# Patient Record
Sex: Female | Born: 1935 | Race: White | Hispanic: No | Marital: Married | Smoking: Never smoker
Health system: Southern US, Community
[De-identification: ages and names within clinical notes are randomized; demographics above are authoritative.]

## PROBLEM LIST (undated history)

## (undated) DIAGNOSIS — K219 Gastro-esophageal reflux disease without esophagitis: Secondary | ICD-10-CM

## (undated) DIAGNOSIS — I10 Essential (primary) hypertension: Secondary | ICD-10-CM

## (undated) DIAGNOSIS — Z8719 Personal history of other diseases of the digestive system: Secondary | ICD-10-CM

## (undated) DIAGNOSIS — E042 Nontoxic multinodular goiter: Secondary | ICD-10-CM

## (undated) DIAGNOSIS — R609 Edema, unspecified: Secondary | ICD-10-CM

## (undated) DIAGNOSIS — E876 Hypokalemia: Secondary | ICD-10-CM

## (undated) DIAGNOSIS — M1990 Unspecified osteoarthritis, unspecified site: Secondary | ICD-10-CM

## (undated) DIAGNOSIS — G2581 Restless legs syndrome: Secondary | ICD-10-CM

## (undated) DIAGNOSIS — K5909 Other constipation: Secondary | ICD-10-CM

## (undated) HISTORY — PX: REPLACEMENT TOTAL KNEE: SUR1224

## (undated) HISTORY — PX: HERNIA REPAIR: SHX51

## (undated) HISTORY — PX: ABDOMINAL HYSTERECTOMY: SHX81

## (undated) HISTORY — PX: HIATAL HERNIA REPAIR: SHX195

---

## 2015-01-15 ENCOUNTER — Encounter (HOSPITAL_COMMUNITY): Payer: Self-pay | Admitting: Emergency Medicine

## 2015-01-15 ENCOUNTER — Emergency Department (HOSPITAL_COMMUNITY): Payer: Medicare Other

## 2015-01-15 ENCOUNTER — Inpatient Hospital Stay (HOSPITAL_COMMUNITY)
Admission: EM | Admit: 2015-01-15 | Discharge: 2015-01-17 | DRG: 683 | Disposition: A | Discharge status: Home / Self Care | Payer: Medicare Other | Attending: Internal Medicine | Admitting: Internal Medicine

## 2015-01-15 DIAGNOSIS — E860 Dehydration: Secondary | ICD-10-CM | POA: Diagnosis present

## 2015-01-15 DIAGNOSIS — Z7982 Long term (current) use of aspirin: Secondary | ICD-10-CM

## 2015-01-15 DIAGNOSIS — K529 Noninfective gastroenteritis and colitis, unspecified: Secondary | ICD-10-CM

## 2015-01-15 DIAGNOSIS — N179 Acute kidney failure, unspecified: Principal | ICD-10-CM | POA: Diagnosis present

## 2015-01-15 DIAGNOSIS — R55 Syncope and collapse: Secondary | ICD-10-CM | POA: Diagnosis present

## 2015-01-15 DIAGNOSIS — E041 Nontoxic single thyroid nodule: Secondary | ICD-10-CM

## 2015-01-15 DIAGNOSIS — G2581 Restless legs syndrome: Secondary | ICD-10-CM | POA: Diagnosis present

## 2015-01-15 DIAGNOSIS — M1990 Unspecified osteoarthritis, unspecified site: Secondary | ICD-10-CM | POA: Diagnosis present

## 2015-01-15 DIAGNOSIS — E042 Nontoxic multinodular goiter: Secondary | ICD-10-CM | POA: Diagnosis present

## 2015-01-15 DIAGNOSIS — I10 Essential (primary) hypertension: Secondary | ICD-10-CM | POA: Diagnosis present

## 2015-01-15 DIAGNOSIS — N390 Urinary tract infection, site not specified: Secondary | ICD-10-CM

## 2015-01-15 DIAGNOSIS — K5900 Constipation, unspecified: Secondary | ICD-10-CM | POA: Diagnosis present

## 2015-01-15 DIAGNOSIS — E538 Deficiency of other specified B group vitamins: Secondary | ICD-10-CM | POA: Diagnosis present

## 2015-01-15 DIAGNOSIS — N281 Cyst of kidney, acquired: Secondary | ICD-10-CM

## 2015-01-15 DIAGNOSIS — R197 Diarrhea, unspecified: Secondary | ICD-10-CM

## 2015-01-15 DIAGNOSIS — N289 Disorder of kidney and ureter, unspecified: Secondary | ICD-10-CM

## 2015-01-15 DIAGNOSIS — E876 Hypokalemia: Secondary | ICD-10-CM | POA: Diagnosis present

## 2015-01-15 DIAGNOSIS — K219 Gastro-esophageal reflux disease without esophagitis: Secondary | ICD-10-CM | POA: Diagnosis present

## 2015-01-15 DIAGNOSIS — Z7952 Long term (current) use of systemic steroids: Secondary | ICD-10-CM

## 2015-01-15 HISTORY — DX: Restless legs syndrome: G25.81

## 2015-01-15 HISTORY — DX: Gastro-esophageal reflux disease without esophagitis: K21.9

## 2015-01-15 HISTORY — DX: Hypokalemia: E87.6

## 2015-01-15 HISTORY — DX: Other constipation: K59.09

## 2015-01-15 HISTORY — DX: Edema, unspecified: R60.9

## 2015-01-15 HISTORY — DX: Unspecified osteoarthritis, unspecified site: M19.90

## 2015-01-15 HISTORY — DX: Essential (primary) hypertension: I10

## 2015-01-15 LAB — URINE MICROSCOPIC-ADD ON: RBC / HPF: NONE SEEN RBC/hpf (ref 0–5)

## 2015-01-15 LAB — CBC WITH DIFFERENTIAL/PLATELET
Basophils Absolute: 0 10*3/uL (ref 0.0–0.1)
Basophils Relative: 0 %
EOS PCT: 0 %
Eosinophils Absolute: 0 10*3/uL (ref 0.0–0.7)
HEMATOCRIT: 37.3 % (ref 36.0–46.0)
HEMOGLOBIN: 12.6 g/dL (ref 12.0–15.0)
LYMPHS ABS: 1.3 10*3/uL (ref 0.7–4.0)
LYMPHS PCT: 9 %
MCH: 31.5 pg (ref 26.0–34.0)
MCHC: 33.8 g/dL (ref 30.0–36.0)
MCV: 93.3 fL (ref 78.0–100.0)
Monocytes Absolute: 0.5 10*3/uL (ref 0.1–1.0)
Monocytes Relative: 3 %
NEUTROS ABS: 13 10*3/uL — AB (ref 1.7–7.7)
Neutrophils Relative %: 88 %
PLATELETS: 273 10*3/uL (ref 150–400)
RBC: 4 MIL/uL (ref 3.87–5.11)
RDW: 12.9 % (ref 11.5–15.5)
WBC: 14.8 10*3/uL — AB (ref 4.0–10.5)

## 2015-01-15 LAB — COMPREHENSIVE METABOLIC PANEL
ALK PHOS: 54 U/L (ref 38–126)
ALT: 17 U/L (ref 14–54)
AST: 26 U/L (ref 15–41)
Albumin: 4.1 g/dL (ref 3.5–5.0)
Anion gap: 9 (ref 5–15)
BILIRUBIN TOTAL: 1 mg/dL (ref 0.3–1.2)
BUN: 24 mg/dL — ABNORMAL HIGH (ref 6–20)
CALCIUM: 9.8 mg/dL (ref 8.9–10.3)
CO2: 29 mmol/L (ref 22–32)
Chloride: 96 mmol/L — ABNORMAL LOW (ref 101–111)
Creatinine, Ser: 1.91 mg/dL — ABNORMAL HIGH (ref 0.44–1.00)
GFR, EST AFRICAN AMERICAN: 26 mL/min — AB (ref >60)
GFR, EST NON AFRICAN AMERICAN: 23 mL/min — AB (ref >60)
GLUCOSE: 115 mg/dL — AB (ref 65–99)
Potassium: 3.9 mmol/L (ref 3.5–5.1)
SODIUM: 134 mmol/L — AB (ref 135–145)
Total Protein: 7 g/dL (ref 6.5–8.1)

## 2015-01-15 LAB — LACTIC ACID, PLASMA
Lactic Acid, Venous: 1.2 mmol/L (ref 0.5–2.0)
Lactic Acid, Venous: 2.1 mmol/L (ref 0.5–2.0)

## 2015-01-15 LAB — URINALYSIS, ROUTINE W REFLEX MICROSCOPIC
Bilirubin Urine: NEGATIVE
GLUCOSE, UA: NEGATIVE mg/dL
HGB URINE DIPSTICK: NEGATIVE
Nitrite: NEGATIVE
PH: 5.5 (ref 5.0–8.0)
Protein, ur: NEGATIVE mg/dL
SPECIFIC GRAVITY, URINE: 1.02 (ref 1.005–1.030)

## 2015-01-15 LAB — TROPONIN I: Troponin I: 0.03 ng/mL (ref <0.031)

## 2015-01-15 MED ORDER — ACETAMINOPHEN 325 MG PO TABS
650.0000 mg | ORAL_TABLET | Freq: Once | ORAL | Status: AC
  Administered 2015-01-15: 650 mg via ORAL
  Filled 2015-01-15: qty 2

## 2015-01-15 MED ORDER — SODIUM CHLORIDE 0.9 % IV BOLUS (SEPSIS)
500.0000 mL | Freq: Once | INTRAVENOUS | Status: AC
  Administered 2015-01-15: 500 mL via INTRAVENOUS

## 2015-01-15 MED ORDER — SODIUM CHLORIDE 0.9 % IV SOLN
INTRAVENOUS | Status: DC
  Administered 2015-01-15: 1000 mL via INTRAVENOUS

## 2015-01-15 MED ORDER — CIPROFLOXACIN IN D5W 400 MG/200ML IV SOLN
400.0000 mg | Freq: Once | INTRAVENOUS | Status: AC
  Administered 2015-01-15: 400 mg via INTRAVENOUS
  Filled 2015-01-15: qty 200

## 2015-01-15 MED ORDER — METRONIDAZOLE IN NACL 5-0.79 MG/ML-% IV SOLN
500.0000 mg | Freq: Once | INTRAVENOUS | Status: AC
  Administered 2015-01-15: 500 mg via INTRAVENOUS
  Filled 2015-01-15: qty 100

## 2015-01-15 NOTE — ED Notes (Signed)
Family requesting something for pt restless leg. EDP notified.

## 2015-01-15 NOTE — Progress Notes (Addendum)
ANTIBIOTIC CONSULT NOTE  Pharmacy Consult for Cipro Indication: colitis  No Known Allergies  Patient Measurements: Height: 5\' 7"  (170.2 cm) Weight: 200 lb (90.719 kg) IBW/kg (Calculated) : 61.6  Vital Signs: Temp: 97.4 F (36.3 C) (11/27 1649) Temp Source: Oral (11/27 1649) BP: 108/76 mmHg (11/27 2130) Pulse Rate: 103 (11/27 2130)  Labs:  Recent Labs  01/15/15 1744  WBC 14.8*  HGB 12.6  PLT 273  CREATININE 1.91*    Estimated Creatinine Clearance: 24.4 mL/min (by C-G formula based on Cr of 1.91).  No results for input(s): VANCOTROUGH, VANCOPEAK, VANCORANDOM, GENTTROUGH, GENTPEAK, GENTRANDOM, TOBRATROUGH, TOBRAPEAK, TOBRARND, AMIKACINPEAK, AMIKACINTROU, AMIKACIN in the last 72 hours.   Microbiology: No results found for this or any previous visit (from the past 720 hour(s)).  Medical History: Past Medical History  Diagnosis Date  . Arthritis   . Hypertension   . GERD (gastroesophageal reflux disease)   . Restless leg   . Hypokalemia   . Peripheral edema   . Chronic constipation    Anti-infectives    Start     Dose/Rate Route Frequency Ordered Stop   01/15/15 2200  ciprofloxacin (CIPRO) IVPB 400 mg     400 mg 200 mL/hr over 60 Minutes Intravenous  Once 01/15/15 2152     01/15/15 2115  metroNIDAZOLE (FLAGYL) IVPB 500 mg     500 mg 100 mL/hr over 60 Minutes Intravenous  Once 01/15/15 2114       Assessment: 81yo female.  Estimated Creatinine Clearance: 24.4 mL/min (by C-G formula based on Cr of 1.91). Cipro for colitis.   Plan:  Cont cipro 400 mg IV q24 hours F/u renal function, cultures and clinical course  Thanks for allowing pharmacy to be a part of this patient's care.  2115, PharmD Clinical Pharmacist 01/15/2015,9:58 PM

## 2015-01-15 NOTE — ED Provider Notes (Signed)
CSN: [id number]     Arrival date & time 01/15/15  1636 History   First MD Initiated Contact with Patient 01/15/15 1717     Chief Complaint  Patient presents with  . Loss of Consciousness      HPI  Pt was seen at 1720.  Per pt and her family, c/o sudden onset and resolution of one episode of syncope that occurred approximately 1500 PTA. Pt states she has hx of chronic constipation, took a laxative last night and today, with resultant diarrhea today. Pt was walking from room to room after using the commode (diarrhea) when she felt nauseated. Pt then walked into another room to "go outside to throw up" when family saw pt "pass out." Pt denies any other symptoms. Denies hx of same. Denies vomiting, no CP/palpitations, no SOB/cough, no abd pain, no fevers, no focal motor weakness, no tingling/numbness in extremities, no facial droop, no slurred speech, no black or blood in stools.    Past Medical History  Diagnosis Date  . Arthritis   . Hypertension   . GERD (gastroesophageal reflux disease)   . Restless leg   . Hypokalemia   . Peripheral edema   . Chronic constipation    Past Surgical History  Procedure Laterality Date  . Replacement total knee    . Hiatal hernia repair      Social History  Substance Use Topics  . Smoking status: Never Smoker   . Smokeless tobacco: None  . Alcohol Use: No    Review of Systems ROS: Statement: All systems negative except as marked or noted in the HPI; Constitutional: Negative for fever and chills. ; ; Eyes: Negative for eye pain, redness and discharge. ; ; ENMT: Negative for ear pain, hoarseness, nasal congestion, sinus pressure and sore throat. ; ; Cardiovascular: Negative for chest pain, palpitations, diaphoresis, dyspnea and peripheral edema. ; ; Respiratory: Negative for cough, wheezing and stridor. ; ; Gastrointestinal: +nausea, diarrhea. Negative for vomiting, abdominal pain, blood in stool, hematemesis, jaundice and rectal bleeding. . ; ;  Genitourinary: Negative for dysuria, flank pain and hematuria. ; ; Musculoskeletal: Negative for back pain and neck pain. Negative for swelling and trauma.; ; Skin: Negative for pruritus, rash, abrasions, blisters, bruising and skin lesion.; ; Neuro: Negative for headache, lightheadedness and neck stiffness. Negative for weakness, extremity weakness, paresthesias, involuntary movement, seizure and +syncope.      Allergies  Review of patient's allergies indicates no known allergies.  Home Medications   Prior to Admission medications   Not on File   BP 120/54 mmHg  Pulse 73  Temp(Src) 97.4 F (36.3 C) (Oral)  Resp 16  Ht 5\' 7"  (1.702 m)  Wt 200 lb (90.719 kg)  BMI 31.32 kg/m2  SpO2 98%   18:14 Orthostatic Vital Signs AC  Orthostatic Lying  - BP- Lying: 121/41 mmHg ; Pulse- Lying: 81  Orthostatic Sitting - BP- Sitting: 156/60 mmHg ; Pulse- Sitting: 88  Orthostatic Standing at 0 minutes - BP- Standing at 0 minutes: 141/61 mmHg ; Pulse- Standing at 0 minutes: 102      Physical Exam  1725: Physical examination:  Nursing notes reviewed; Vital signs and O2 SAT reviewed;  Constitutional: Well developed, Well nourished, In no acute distress; Head:  Normocephalic, atraumatic; Eyes: EOMI, PERRL, No scleral icterus; ENMT: Mouth and pharynx normal, Mucous membranes dry; Neck: Supple, Full range of motion, No lymphadenopathy; Cardiovascular: Regular rate and rhythm, No gallop; Respiratory: Breath sounds clear & equal bilaterally, No wheezes.  Speaking  full sentences with ease, Normal respiratory effort/excursion; Chest: Nontender, Movement normal; Abdomen: Soft, Nontender, Nondistended, Normal bowel sounds; Genitourinary: No CVA tenderness; Spine:  No midline CS, TS, LS tenderness.;; Extremities: Pulses normal, Pelvis stable. No tenderness, +2 pedal edema bilat. No calf asymmetry.; Neuro: AA&Ox3, Major CN grossly intact. Speech clear.  No facial droop.  No nystagmus. Grips equal. Strength 5/5 equal  bilat UE's and LE's.  DTR 2/4 equal bilat UE's and LE's.  No gross sensory deficits.  Normal cerebellar testing bilat UE's (finger-nose) and LE's (heel-shin)..; Skin: Color normal, Warm, Dry.   ED Course  Procedures (including critical care time)   Labs Review  Imaging Review I have personally reviewed and evaluated these images and lab results as part of my medical decision-making.   EKG Interpretation   Date/Time:  Sunday January 15 2015 17:39:34 EST Ventricular Rate:  71 PR Interval:  183 QRS Duration: 97 QT Interval:  424 QTC Calculation: 461 R Axis:   24 Text Interpretation:  Sinus rhythm Probable anteroseptal infarct, old  Borderline repolarization abnormality No old tracing to compare Confirmed  by [facility]  MD, Nicholos Johns [phone]) on 01/15/2015 5:58:25 PM      MDM  MDM Reviewed: previous chart, nursing note and vitals Reviewed previous: labs and ECG Interpretation: labs, ECG, x-ray and CT scan     Results for orders placed or performed during the hospital encounter of 01/15/15  Comprehensive metabolic panel  Result Value Ref Range   Sodium 134 (L) 135 - 145 mmol/L   Potassium 3.9 3.5 - 5.1 mmol/L   Chloride 96 (L) 101 - 111 mmol/L   CO2 29 22 - 32 mmol/L   Glucose, Bld 115 (H) 65 - 99 mg/dL   BUN 24 (H) 6 - 20 mg/dL   Creatinine, Ser 7.51 (H) 0.44 - 1.00 mg/dL   Calcium 9.8 8.9 - 70.0 mg/dL   Total Protein 7.0 6.5 - 8.1 g/dL   Albumin 4.1 3.5 - 5.0 g/dL   AST 26 15 - 41 U/L   ALT 17 14 - 54 U/L   Alkaline Phosphatase 54 38 - 126 U/L   Total Bilirubin 1.0 0.3 - 1.2 mg/dL   GFR calc non Af Amer 23 (L) >60 mL/min   GFR calc Af Amer 26 (L) >60 mL/min   Anion gap 9 5 - 15  Troponin I  Result Value Ref Range   Troponin I 0.03 <0.031 ng/mL  Lactic acid, plasma  Result Value Ref Range   Lactic Acid, Venous 1.2 0.5 - 2.0 mmol/L  CBC with Differential  Result Value Ref Range   WBC 14.8 (H) 4.0 - 10.5 K/uL   RBC 4.00 3.87 - 5.11 MIL/uL   Hemoglobin 12.6  12.0 - 15.0 g/dL   HCT 17.4 94.4 - 96.7 %   MCV 93.3 78.0 - 100.0 fL   MCH 31.5 26.0 - 34.0 pg   MCHC 33.8 30.0 - 36.0 g/dL   RDW 59.1 63.8 - 46.6 %   Platelets 273 150 - 400 K/uL   Neutrophils Relative % 88 %   Neutro Abs 13.0 (H) 1.7 - 7.7 K/uL   Lymphocytes Relative 9 %   Lymphs Abs 1.3 0.7 - 4.0 K/uL   Monocytes Relative 3 %   Monocytes Absolute 0.5 0.1 - 1.0 K/uL   Eosinophils Relative 0 %   Eosinophils Absolute 0.0 0.0 - 0.7 K/uL   Basophils Relative 0 %   Basophils Absolute 0.0 0.0 - 0.1 K/uL  Urinalysis, Routine w reflex  microscopic  Result Value Ref Range   Color, Urine AMBER (A) YELLOW   APPearance CLEAR CLEAR   Specific Gravity, Urine 1.020 1.005 - 1.030   pH 5.5 5.0 - 8.0   Glucose, UA NEGATIVE NEGATIVE mg/dL   Hgb urine dipstick NEGATIVE NEGATIVE   Bilirubin Urine NEGATIVE NEGATIVE   Ketones, ur TRACE (A) NEGATIVE mg/dL   Protein, ur NEGATIVE NEGATIVE mg/dL   Nitrite NEGATIVE NEGATIVE   Leukocytes, UA TRACE (A) NEGATIVE  Urine microscopic-add on  Result Value Ref Range   Squamous Epithelial / LPF 6-30 (A) NONE SEEN   WBC, UA 6-30 0 - 5 WBC/hpf   RBC / HPF NONE SEEN 0 - 5 RBC/hpf   Bacteria, UA MANY (A) NONE SEEN   Casts GRANULAR CAST (A) NEGATIVE   Urine-Other MUCOUS PRESENT    Ct Abdomen Pelvis Wo Contrast 01/15/2015  CLINICAL DATA:  Nausea and constipation today with syncope, diffuse cramping and intermittent abdominal pain EXAM: CT ABDOMEN AND PELVIS WITHOUT CONTRAST TECHNIQUE: Multidetector CT imaging of the abdomen and pelvis was performed following the standard protocol without IV contrast. COMPARISON:  01/15/2015 radiographs FINDINGS: Lower chest: Minimal pericardial thickening or fluid. Lung bases clear. Hepatobiliary: 3 low-attenuation lesions in the liver, 1 in the medial left lobe and 2 in the right lobe. These all measure about 2.5-3 cm with average attenuation value consistent with simple fluid. A few tiny hepatic calcifications are likely not of  acute consequence. Gallbladder is normal. Pancreas: There is fatty atrophy of the pancreas. Pancreas is otherwise negative. Spleen: Negative Adrenals/Urinary Tract: There is a 2.2 cm left adrenal gland with average attenuation value of 42. Right adrenal gland is normal. Indeterminate exophytic 1.3 cm lesion midpole right kidney. Indeterminate 1.4 cm exophytic lesion midpole left kidney. No stones or hydronephrosis. Bladder is negative. Stomach/Bowel: Stomach and small bowel are normal. Entire descending colon shows mild surrounding inflammatory change. There is mild diverticulosis of the sigmoid colon without evidence of diverticulitis. Vascular/Lymphatic: Atherosclerotic aortoiliac calcification Reproductive: Uterus not identified.  No pelvic masses Other: No free fluid or air in the abdomen or pelvis Musculoskeletal: no acute findings IMPRESSION: Findings consistent with nonspecific colitis diffusely involving the descending colon Incidental findings consisting of hepatic cysts. Indeterminate left adrenal gland. Possible adenoma. Consider nonemergent MRI to characterize further. Indeterminate bilateral renal lesions possibly cysts but not fully evaluated without contrast. Consider renal ultrasound. Electronically Signed   By: Esperanza Heir M.D.   On: 01/15/2015 21:09   Dg Chest 2 View 01/15/2015  CLINICAL DATA:  Nausea and constipation today with syncope EXAM: CHEST  2 VIEW COMPARISON:  None. FINDINGS: Heart size upper normal. Vascular pattern normal. Lungs clear. No pleural effusions. IMPRESSION: No active cardiopulmonary disease. Electronically Signed   By: Esperanza Heir M.D.   On: 01/15/2015 19:14   Ct Head Wo Contrast 01/15/2015  CLINICAL DATA:  Syncope today at home, fell EXAM: CT HEAD WITHOUT CONTRAST CT CERVICAL SPINE WITHOUT CONTRAST TECHNIQUE: Multidetector CT imaging of the head and cervical spine was performed following the standard protocol without intravenous contrast. Multiplanar CT image  reconstructions of the cervical spine were also generated. COMPARISON:  None. FINDINGS: CT HEAD FINDINGS Diffuse age-related atrophy. No hydrocephalus. No mass infarct hemorrhage or extra-axial fluid. No skull fracture. CT CERVICAL SPINE FINDINGS 2.5 cm heterogeneous left thyroid nodule with calcification. Other smaller nodules bilaterally. Lung apices clear. No acute soft tissue abnormalities. No evidence of cervical spine fracture. Normal alignment. Significant degenerative change at the atlantodental articulation. Moderate degenerative  disc disease throughout the entire cervical spine. No fracture. IMPRESSION: 1. No acute intracranial abnormality 2. Degenerative changes throughout the cervical spine with no fracture 3. Recommend nonemergent ultrasound of the thyroid for multiple nodules. Electronically Signed   By: Esperanza Heir M.D.   On: 01/15/2015 19:10   Ct Cervical Spine Wo Contrast 01/15/2015  CLINICAL DATA:  Syncope today at home, fell EXAM: CT HEAD WITHOUT CONTRAST CT CERVICAL SPINE WITHOUT CONTRAST TECHNIQUE: Multidetector CT imaging of the head and cervical spine was performed following the standard protocol without intravenous contrast. Multiplanar CT image reconstructions of the cervical spine were also generated. COMPARISON:  None. FINDINGS: CT HEAD FINDINGS Diffuse age-related atrophy. No hydrocephalus. No mass infarct hemorrhage or extra-axial fluid. No skull fracture. CT CERVICAL SPINE FINDINGS 2.5 cm heterogeneous left thyroid nodule with calcification. Other smaller nodules bilaterally. Lung apices clear. No acute soft tissue abnormalities. No evidence of cervical spine fracture. Normal alignment. Significant degenerative change at the atlantodental articulation. Moderate degenerative disc disease throughout the entire cervical spine. No fracture. IMPRESSION: 1. No acute intracranial abnormality 2. Degenerative changes throughout the cervical spine with no fracture 3. Recommend nonemergent  ultrasound of the thyroid for multiple nodules. Electronically Signed   By: Esperanza Heir M.D.   On: 01/15/2015 19:10   Dg Abd 2 Views 01/15/2015  CLINICAL DATA:  Nausea and constipation. Syncopal episode after exiting the restroom today. EXAM: ABDOMEN - 2 VIEW COMPARISON:  None. FINDINGS: The lung bases are clear.  The heart is enlarged. There is no evidence for obstruction or free air. There does appear to be some thickening about the distal sigmoid colon. Degenerative changes are noted in the lower lumbar spine and bilateral hips. IMPRESSION: 1. Question thickening about the distal colon. This may represent focal colitis. 2. No evidence for proximal obstruction or free air. 3. Degenerative changes in the lumbar spine and hips. Electronically Signed   By: Marin Roberts M.D.   On: 01/15/2015 19:16    2155:  IV cipro/flagyl started for colitis on CT scan as well as coverage for UTI. UC is pending. Pt is not orthostatic on VS. IVF continues for elevated BUN/Cr; no old to compare. Dx and testing d/w pt and family.  Questions answered.  Verb understanding, agreeable to admit.  T/C to [facility] Dr. Lovell Sheehan, case discussed, including:  HPI, pertinent PM/SHx, VS/PE, dx testing, ED course and treatment:  Agreeable to admit, requests to write temporary orders, obtain tele bed to team APAdmits.   Samuel Jester, DO 01/17/15 2000

## 2015-01-15 NOTE — ED Notes (Signed)
PT reports having nausea and constipation today and had a syncopal episode coming out of the bathroom today around 1500 per family.

## 2015-01-15 NOTE — H&P (Signed)
[facility] Admission History and Physical       Leah Crane GXQ:[id number] DOB: January 03, 1927 DOA: 01/15/2015  Referring physician: EDP PCP: Olen Cordial, MD  Specialists:   Chief Complaint: Passed Out  HPI: Leah Crane is a 81 y.o. female with history of HTN, and Arthritis who was brought to the ED after she suffered a syncopal episode at home.   Her daughter reports that she had been having constipation and took a lot of Senakot, and it made her have increased stools today and she went tot he bathroom, finished and came out and was walking about and became nauseous and passed out for a few seconds.  PEr her daughter she had been havign complaints of nausea and Cramping ABD pain.    EMS was called and she was brought to the ED.  A CT scan of the ABD was performed and revealed Colitis, and her labs revealed a positive UA, and she was paced on IV Cipro and Flagyl Rx, and referred for admission for a syncope workup.       Past Medical History  Diagnosis Date  . Arthritis   . Hypertension   . GERD (gastroesophageal reflux disease)   . Restless leg   . Hypokalemia   . Peripheral edema   . Chronic constipation      Past Surgical History  Procedure Laterality Date  . Replacement total knee    . Hiatal hernia repair        Prior to Admission medications   Medication Sig Start Date End Date Taking? Authorizing Provider  aspirin EC 81 MG tablet Take 81 mg by mouth every morning.   Yes Historical Provider, MD  calcium-vitamin D (OSCAL WITH D) 500-200 MG-UNIT tablet Take 1 tablet by mouth 2 (two) times daily.   Yes Historical Provider, MD  felodipine (PLENDIL) 10 MG 24 hr tablet Take 10 mg by mouth every morning.  01/05/15  Yes Historical Provider, MD  furosemide (LASIX) 40 MG tablet Take 80 mg by mouth daily as needed for fluid or edema.   Yes Historical Provider, MD  hydrochlorothiazide (HYDRODIURIL) 25 MG tablet Take 25 mg by mouth every morning.   Yes Historical  Provider, MD  HYDROcodone-acetaminophen (NORCO) 10-325 MG tablet Take 1 tablet by mouth 4 (four) times daily.   Yes Historical Provider, MD  hydroxychloroquine (PLAQUENIL) 200 MG tablet Take 200 mg by mouth 2 (two) times daily.   Yes Historical Provider, MD  metolazone (ZAROXOLYN) 2.5 MG tablet Take 2.5 mg by mouth every Monday, Wednesday, and Friday.   Yes Historical Provider, MD  omeprazole (PRILOSEC) 20 MG capsule Take 20 mg by mouth every morning.   Yes Historical Provider, MD  piroxicam (FELDENE) 10 MG capsule Take 10 mg by mouth every morning.   Yes Historical Provider, MD  potassium chloride SA (K-DUR,KLOR-CON) 20 MEQ tablet Take 20 mEq by mouth 2 (two) times daily.   Yes Historical Provider, MD  pramipexole (MIRAPEX) 0.125 MG tablet Take 0.125 mg by mouth at bedtime.   Yes Historical Provider, MD  predniSONE (DELTASONE) 5 MG tablet Take 5 mg by mouth every morning.   Yes Historical Provider, MD  quinapril (ACCUPRIL) 40 MG tablet Take 40 mg by mouth 2 (two) times daily.   Yes Historical Provider, MD  tiZANidine (ZANAFLEX) 4 MG tablet Take 4 mg by mouth 3 (three) times daily.   Yes Historical Provider, MD  vitamin B-12 (CYANOCOBALAMIN) 1000 MCG tablet Take 1,000 mcg by mouth every morning.   Yes  Historical Provider, MD     No Known Allergies  Social History:  reports that she has never smoked. She does not have any smokeless tobacco history on file. She reports that she does not drink alcohol or use illicit drugs.    History reviewed. No pertinent family history.     Physical Exam:  GEN:  Pleasant Elderly 81 y.o.  Caucasian female examined and in no acute distress; cooperative with exam Filed Vitals:   01/15/15 1930 01/15/15 2030 01/15/15 2130 01/15/15 2200  BP: 113/64 155/77 108/76 113/98  Pulse: 83 88 103 82  Temp:      TempSrc:      Resp: Height:      Weight:      SpO2: 93% 96% 99% 96%   Blood pressure 113/98, pulse 82, temperature 97.4 F (36.3 C),  temperature source Oral, resp. rate 24, height  (1.702 m), weight 90.719 kg (200 lb), SpO2 96 %. PSYCH: She is alert and oriented x4; does not appear anxious does not appear depressed; affect is normal HEENT: Normocephalic and Atraumatic, Mucous membranes pink; PERRLA; EOM intact; Fundi:  Benign;  No scleral icterus, Nares: Patent, Oropharynx: Clear,     Neck:  FROM, No Cervical Lymphadenopathy nor Thyromegaly or Carotid Bruit; No JVD; Breasts:: Not examined CHEST WALL: No tenderness CHEST: Normal respiration, clear to auscultation bilaterally HEART: Regular rate and rhythm; no murmurs rubs or gallops BACK: No kyphosis or scoliosis; No CVA tenderness ABDOMEN: Positive Bowel Sounds,  Soft Non-Tender, No Rebound or Guarding; No Masses, No Organomegaly, No Pannus; No Intertriginous candida. Rectal Exam: Not done EXTREMITIES: No Cyanosis, Clubbing, or Edema; No Ulcerations. Genitalia: not examined PULSES: 2+ and symmetric SKIN: Normal hydration no rash or ulceration CNS:  Alert and Oriented x 4, No Focal Deficits Vascular: pulses palpable throughout    Labs on Admission:  Basic Metabolic Panel:  Recent Labs Lab 01/15/15 1744  NA 134*  K 3.9  CL 96*  CO2 29  GLUCOSE 115*  BUN 24*  CREATININE 1.91*  CALCIUM 9.8   Liver Function Tests:  Recent Labs Lab 01/15/15 1744  AST 26  ALT 17  ALKPHOS 54  BILITOT 1.0  PROT 7.0  ALBUMIN 4.1   No results for input(s): LIPASE, AMYLASE in the last 168 hours. No results for input(s): AMMONIA in the last 168 hours. CBC:  Recent Labs Lab 01/15/15 1744  WBC 14.8*  NEUTROABS 13.0*  HGB 12.6  HCT 37.3  MCV 93.3  PLT 273   Cardiac Enzymes:  Recent Labs Lab 01/15/15 1744  TROPONINI 0.03    BNP (last 3 results) No results for input(s): BNP in the last 8760 hours.  ProBNP (last 3 results) No results for input(s): PROBNP in the last 8760 hours.  CBG: No results for input(s): GLUCAP in the last 168 hours.  Radiological  Exams on Admission: Ct Abdomen Pelvis Wo Contrast  01/15/2015  CLINICAL DATA:  Nausea and constipation today with syncope, diffuse cramping and intermittent abdominal pain EXAM: CT ABDOMEN AND PELVIS WITHOUT CONTRAST TECHNIQUE: Multidetector CT imaging of the abdomen and pelvis was performed following the standard protocol without IV contrast. COMPARISON:  01/15/2015 radiographs FINDINGS: Lower chest: Minimal pericardial thickening or fluid. Lung bases clear. Hepatobiliary: 3 low-attenuation lesions in the liver, 1 in the medial left lobe and 2 in the right lobe. These all measure about 2.5-3 cm with average attenuation value consistent with simple fluid. A few tiny hepatic calcifications are likely not  of acute consequence. Gallbladder is normal. Pancreas: There is fatty atrophy of the pancreas. Pancreas is otherwise negative. Spleen: Negative Adrenals/Urinary Tract: There is a 2.2 cm left adrenal gland with average attenuation value of 42. Right adrenal gland is normal. Indeterminate exophytic 1.3 cm lesion midpole right kidney. Indeterminate 1.4 cm exophytic lesion midpole left kidney. No stones or hydronephrosis. Bladder is negative. Stomach/Bowel: Stomach and small bowel are normal. Entire descending colon shows mild surrounding inflammatory change. There is mild diverticulosis of the sigmoid colon without evidence of diverticulitis. Vascular/Lymphatic: Atherosclerotic aortoiliac calcification Reproductive: Uterus not identified.  No pelvic masses Other: No free fluid or air in the abdomen or pelvis Musculoskeletal: no acute findings IMPRESSION: Findings consistent with nonspecific colitis diffusely involving the descending colon Incidental findings consisting of hepatic cysts. Indeterminate left adrenal gland. Possible adenoma. Consider nonemergent MRI to characterize further. Indeterminate bilateral renal lesions possibly cysts but not fully evaluated without contrast. Consider renal ultrasound.  Electronically Signed   By: Esperanza Heir M.D.   On: 01/15/2015 21:09   Dg Chest 2 View  01/15/2015  CLINICAL DATA:  Nausea and constipation today with syncope EXAM: CHEST  2 VIEW COMPARISON:  None. FINDINGS: Heart size upper normal. Vascular pattern normal. Lungs clear. No pleural effusions. IMPRESSION: No active cardiopulmonary disease. Electronically Signed   By: Esperanza Heir M.D.   On: 01/15/2015 19:14   Ct Head Wo Contrast  01/15/2015  CLINICAL DATA:  Syncope today at home, fell EXAM: CT HEAD WITHOUT CONTRAST CT CERVICAL SPINE WITHOUT CONTRAST TECHNIQUE: Multidetector CT imaging of the head and cervical spine was performed following the standard protocol without intravenous contrast. Multiplanar CT image reconstructions of the cervical spine were also generated. COMPARISON:  None. FINDINGS: CT HEAD FINDINGS Diffuse age-related atrophy. No hydrocephalus. No mass infarct hemorrhage or extra-axial fluid. No skull fracture. CT CERVICAL SPINE FINDINGS 2.5 cm heterogeneous left thyroid nodule with calcification. Other smaller nodules bilaterally. Lung apices clear. No acute soft tissue abnormalities. No evidence of cervical spine fracture. Normal alignment. Significant degenerative change at the atlantodental articulation. Moderate degenerative disc disease throughout the entire cervical spine. No fracture. IMPRESSION: 1. No acute intracranial abnormality 2. Degenerative changes throughout the cervical spine with no fracture 3. Recommend nonemergent ultrasound of the thyroid for multiple nodules. Electronically Signed   By: Esperanza Heir M.D.   On: 01/15/2015 19:10   Ct Cervical Spine Wo Contrast  01/15/2015  CLINICAL DATA:  Syncope today at home, fell EXAM: CT HEAD WITHOUT CONTRAST CT CERVICAL SPINE WITHOUT CONTRAST TECHNIQUE: Multidetector CT imaging of the head and cervical spine was performed following the standard protocol without intravenous contrast. Multiplanar CT image reconstructions of  the cervical spine were also generated. COMPARISON:  None. FINDINGS: CT HEAD FINDINGS Diffuse age-related atrophy. No hydrocephalus. No mass infarct hemorrhage or extra-axial fluid. No skull fracture. CT CERVICAL SPINE FINDINGS 2.5 cm heterogeneous left thyroid nodule with calcification. Other smaller nodules bilaterally. Lung apices clear. No acute soft tissue abnormalities. No evidence of cervical spine fracture. Normal alignment. Significant degenerative change at the atlantodental articulation. Moderate degenerative disc disease throughout the entire cervical spine. No fracture. IMPRESSION: 1. No acute intracranial abnormality 2. Degenerative changes throughout the cervical spine with no fracture 3. Recommend nonemergent ultrasound of the thyroid for multiple nodules. Electronically Signed   By: Esperanza Heir M.D.   On: 01/15/2015 19:10   Dg Abd 2 Views  01/15/2015  CLINICAL DATA:  Nausea and constipation. Syncopal episode after exiting the restroom today. EXAM: ABDOMEN -  2 VIEW COMPARISON:  None. FINDINGS: The lung bases are clear.  The heart is enlarged. There is no evidence for obstruction or free air. There does appear to be some thickening about the distal sigmoid colon. Degenerative changes are noted in the lower lumbar spine and bilateral hips. IMPRESSION: 1. Question thickening about the distal colon. This may represent focal colitis. 2. No evidence for proximal obstruction or free air. 3. Degenerative changes in the lumbar spine and hips. Electronically Signed   By: Marin Roberts M.D.   On: 01/15/2015 19:16     EKG: Independently reviewed. Normal Sinus Rhythm Rate =71 Old Anterior Infarct Changes present   Assessment/Plan:   81 y.o. female with  Principal Problem:   1.      Syncope   Syncope workup   Cardiac Monitoring  Active Problems:   2.     AKI (acute kidney injury) (HCC)   Gentle IVFs   Hold HCTZ, Metolazone , and Lasix PRN, and    Monitor BUN/Cr     3.      Colitis- on CT scan of the ADB   IV Cipro and Flagyl     4.     Urinary tract infectious disease   Covered By IV Cipro   Urine C+S sent    5.     Hypertension   continue Felodipine as BP tolerates   Monitor BPs       6.     DVT Prophylaxis   Lovenox    Code Status:     FULL CODE       Family Communication:   Daughter at Bedside   Disposition Plan:     Observation Status        Time spent:  16 Minutes      Ron Parker [facility] Pager [phone]   If 7AM -7PM Please Contact the Day Rounding Team MD for [facility]  If 7PM-7AM, Please Contact Night-Floor Coverage  [email or web address] Password [email or web address] 01/15/2015, 11:17 PM     ADDENDUM:   Patient was seen and examined on 01/15/2015

## 2015-01-15 NOTE — ED Notes (Signed)
EDP notified of lactic acid was drawn before canceling. Result value 2.1 given. Order replaced to allow value to cross into chart. 500 ml NS bolus ordered.

## 2015-01-16 DIAGNOSIS — Z7982 Long term (current) use of aspirin: Secondary | ICD-10-CM | POA: Diagnosis not present

## 2015-01-16 DIAGNOSIS — E860 Dehydration: Secondary | ICD-10-CM | POA: Diagnosis present

## 2015-01-16 DIAGNOSIS — I10 Essential (primary) hypertension: Secondary | ICD-10-CM | POA: Diagnosis present

## 2015-01-16 DIAGNOSIS — Z7952 Long term (current) use of systemic steroids: Secondary | ICD-10-CM | POA: Diagnosis not present

## 2015-01-16 DIAGNOSIS — M1990 Unspecified osteoarthritis, unspecified site: Secondary | ICD-10-CM | POA: Diagnosis present

## 2015-01-16 DIAGNOSIS — K529 Noninfective gastroenteritis and colitis, unspecified: Secondary | ICD-10-CM

## 2015-01-16 DIAGNOSIS — E876 Hypokalemia: Secondary | ICD-10-CM | POA: Diagnosis present

## 2015-01-16 DIAGNOSIS — N390 Urinary tract infection, site not specified: Secondary | ICD-10-CM

## 2015-01-16 DIAGNOSIS — K5900 Constipation, unspecified: Secondary | ICD-10-CM | POA: Diagnosis present

## 2015-01-16 DIAGNOSIS — G2581 Restless legs syndrome: Secondary | ICD-10-CM | POA: Diagnosis present

## 2015-01-16 DIAGNOSIS — N281 Cyst of kidney, acquired: Secondary | ICD-10-CM | POA: Diagnosis present

## 2015-01-16 DIAGNOSIS — K219 Gastro-esophageal reflux disease without esophagitis: Secondary | ICD-10-CM | POA: Diagnosis present

## 2015-01-16 DIAGNOSIS — E042 Nontoxic multinodular goiter: Secondary | ICD-10-CM | POA: Diagnosis present

## 2015-01-16 DIAGNOSIS — N179 Acute kidney failure, unspecified: Secondary | ICD-10-CM | POA: Diagnosis present

## 2015-01-16 DIAGNOSIS — E538 Deficiency of other specified B group vitamins: Secondary | ICD-10-CM | POA: Diagnosis present

## 2015-01-16 DIAGNOSIS — R55 Syncope and collapse: Secondary | ICD-10-CM | POA: Diagnosis present

## 2015-01-16 LAB — BASIC METABOLIC PANEL
ANION GAP: 7 (ref 5–15)
BUN: 20 mg/dL (ref 6–20)
CALCIUM: 8.7 mg/dL — AB (ref 8.9–10.3)
CHLORIDE: 100 mmol/L — AB (ref 101–111)
CO2: 26 mmol/L (ref 22–32)
CREATININE: 1.33 mg/dL — AB (ref 0.44–1.00)
GFR calc non Af Amer: 35 mL/min — ABNORMAL LOW (ref >60)
GFR, EST AFRICAN AMERICAN: 41 mL/min — AB (ref >60)
Glucose, Bld: 107 mg/dL — ABNORMAL HIGH (ref 65–99)
Potassium: 3 mmol/L — ABNORMAL LOW (ref 3.5–5.1)
SODIUM: 133 mmol/L — AB (ref 135–145)

## 2015-01-16 LAB — CBC
HCT: 33.2 % — ABNORMAL LOW (ref 36.0–46.0)
HEMOGLOBIN: 11.4 g/dL — AB (ref 12.0–15.0)
MCH: 32 pg (ref 26.0–34.0)
MCHC: 34.3 g/dL (ref 30.0–36.0)
MCV: 93.3 fL (ref 78.0–100.0)
PLATELETS: 235 10*3/uL (ref 150–400)
RBC: 3.56 MIL/uL — AB (ref 3.87–5.11)
RDW: 13 % (ref 11.5–15.5)
WBC: 6.3 10*3/uL (ref 4.0–10.5)

## 2015-01-16 LAB — LACTIC ACID, PLASMA
LACTIC ACID, VENOUS: 0.9 mmol/L (ref 0.5–2.0)
Lactic Acid, Venous: 0.7 mmol/L (ref 0.5–2.0)

## 2015-01-16 MED ORDER — ONDANSETRON HCL 4 MG/2ML IJ SOLN: 4.0000 mg | Freq: Four times a day (QID) | INTRAMUSCULAR | Status: DC | PRN

## 2015-01-16 MED ORDER — CIPROFLOXACIN IN D5W 400 MG/200ML IV SOLN
400.0000 mg | INTRAVENOUS | Status: DC
  Administered 2015-01-16: 400 mg via INTRAVENOUS
  Filled 2015-01-16 (×2): qty 200

## 2015-01-16 MED ORDER — ONDANSETRON HCL 4 MG PO TABS
4.0000 mg | ORAL_TABLET | Freq: Four times a day (QID) | ORAL | Status: DC | PRN
Start: 2015-01-16 — End: 2015-01-17

## 2015-01-16 MED ORDER — HYDROCODONE-ACETAMINOPHEN 10-325 MG PO TABS
1.0000 | ORAL_TABLET | Freq: Four times a day (QID) | ORAL | Status: DC
  Administered 2015-01-16 – 2015-01-17 (×4): 1 via ORAL
  Filled 2015-01-16 (×4): qty 1

## 2015-01-16 MED ORDER — HYDROXYCHLOROQUINE SULFATE 200 MG PO TABS
200.0000 mg | ORAL_TABLET | Freq: Two times a day (BID) | ORAL | Status: DC
  Administered 2015-01-16 – 2015-01-17 (×3): 200 mg via ORAL
  Filled 2015-01-16 (×5): qty 1

## 2015-01-16 MED ORDER — TIZANIDINE HCL 4 MG PO TABS
4.0000 mg | ORAL_TABLET | Freq: Three times a day (TID) | ORAL | Status: DC
  Administered 2015-01-16 – 2015-01-17 (×4): 4 mg via ORAL
  Filled 2015-01-16 (×4): qty 1

## 2015-01-16 MED ORDER — ACETAMINOPHEN 650 MG RE SUPP: 650.0000 mg | Freq: Four times a day (QID) | RECTAL | Status: DC | PRN

## 2015-01-16 MED ORDER — HYDROMORPHONE HCL 1 MG/ML IJ SOLN: 0.5000 mg | INTRAMUSCULAR | Status: DC | PRN

## 2015-01-16 MED ORDER — ASPIRIN EC 81 MG PO TBEC
81.0000 mg | DELAYED_RELEASE_TABLET | Freq: Every morning | ORAL | Status: DC
  Administered 2015-01-16 – 2015-01-17 (×2): 81 mg via ORAL
  Filled 2015-01-16 (×2): qty 1

## 2015-01-16 MED ORDER — METRONIDAZOLE IN NACL 5-0.79 MG/ML-% IV SOLN
500.0000 mg | Freq: Three times a day (TID) | INTRAVENOUS | Status: DC
  Administered 2015-01-16 (×3): 500 mg via INTRAVENOUS
  Filled 2015-01-16 (×4): qty 100

## 2015-01-16 MED ORDER — POTASSIUM CHLORIDE CRYS ER 20 MEQ PO TBCR
20.0000 meq | EXTENDED_RELEASE_TABLET | Freq: Two times a day (BID) | ORAL | Status: DC
  Administered 2015-01-16 – 2015-01-17 (×3): 20 meq via ORAL
  Filled 2015-01-16 (×4): qty 1

## 2015-01-16 MED ORDER — FELODIPINE ER 5 MG PO TB24
10.0000 mg | ORAL_TABLET | Freq: Every morning | ORAL | Status: DC
  Administered 2015-01-16 – 2015-01-17 (×2): 10 mg via ORAL
  Filled 2015-01-16 (×2): qty 2
  Filled 2015-01-16: qty 1

## 2015-01-16 MED ORDER — PANTOPRAZOLE SODIUM 40 MG PO TBEC
40.0000 mg | DELAYED_RELEASE_TABLET | Freq: Every day | ORAL | Status: DC
  Administered 2015-01-16 – 2015-01-17 (×2): 40 mg via ORAL
  Filled 2015-01-16 (×2): qty 1

## 2015-01-16 MED ORDER — PRAMIPEXOLE DIHYDROCHLORIDE 0.125 MG PO TABS
0.1250 mg | ORAL_TABLET | Freq: Every day | ORAL | Status: DC
  Administered 2015-01-16: 0.125 mg via ORAL
  Filled 2015-01-16 (×3): qty 1

## 2015-01-16 MED ORDER — SODIUM CHLORIDE 0.9 % IV SOLN
INTRAVENOUS | Status: DC
  Administered 2015-01-16: 75 mL/h via INTRAVENOUS
  Administered 2015-01-16: 23:00:00 via INTRAVENOUS
  Administered 2015-01-16: 75 mL/h via INTRAVENOUS

## 2015-01-16 MED ORDER — SODIUM CHLORIDE 0.9 % IJ SOLN
3.0000 mL | Freq: Two times a day (BID) | INTRAMUSCULAR | Status: DC
  Administered 2015-01-16 – 2015-01-17 (×2): 3 mL via INTRAVENOUS

## 2015-01-16 MED ORDER — HEPARIN SODIUM (PORCINE) 5000 UNIT/ML IJ SOLN
5000.0000 [IU] | Freq: Three times a day (TID) | INTRAMUSCULAR | Status: DC
  Administered 2015-01-16 – 2015-01-17 (×3): 5000 [IU] via SUBCUTANEOUS
  Filled 2015-01-16 (×2): qty 1

## 2015-01-16 MED ORDER — HYDRALAZINE HCL 20 MG/ML IJ SOLN: 10.0000 mg | Freq: Four times a day (QID) | INTRAMUSCULAR | Status: DC | PRN

## 2015-01-16 MED ORDER — ENOXAPARIN SODIUM 30 MG/0.3ML ~~LOC~~ SOLN
30.0000 mg | SUBCUTANEOUS | Status: DC
  Administered 2015-01-16: 30 mg via SUBCUTANEOUS
  Filled 2015-01-16: qty 0.3

## 2015-01-16 MED ORDER — CIPROFLOXACIN IN D5W 400 MG/200ML IV SOLN: 400.0000 mg | Freq: Two times a day (BID) | INTRAVENOUS | Status: DC

## 2015-01-16 MED ORDER — OXYCODONE HCL 5 MG PO TABS
5.0000 mg | ORAL_TABLET | ORAL | Status: DC | PRN
  Administered 2015-01-16: 5 mg via ORAL
  Filled 2015-01-16: qty 1

## 2015-01-16 MED ORDER — PREDNISONE 10 MG PO TABS
5.0000 mg | ORAL_TABLET | Freq: Every morning | ORAL | Status: DC
  Administered 2015-01-16 – 2015-01-17 (×2): 5 mg via ORAL
  Filled 2015-01-16 (×2): qty 1

## 2015-01-16 MED ORDER — SODIUM CHLORIDE 0.9 % IV SOLN
INTRAVENOUS | Status: AC
  Administered 2015-01-16: 75 mL/h via INTRAVENOUS

## 2015-01-16 MED ORDER — VITAMIN B-12 1000 MCG PO TABS
1000.0000 ug | ORAL_TABLET | Freq: Every morning | ORAL | Status: DC
  Administered 2015-01-16 – 2015-01-17 (×2): 1000 ug via ORAL
  Filled 2015-01-16 (×2): qty 1

## 2015-01-16 MED ORDER — ACETAMINOPHEN 325 MG PO TABS
650.0000 mg | ORAL_TABLET | Freq: Four times a day (QID) | ORAL | Status: DC | PRN
  Administered 2015-01-16: 650 mg via ORAL
  Filled 2015-01-16 (×2): qty 2

## 2015-01-16 MED ORDER — ALUM & MAG HYDROXIDE-SIMETH 200-200-20 MG/5ML PO SUSP
30.0000 mL | Freq: Four times a day (QID) | ORAL | Status: DC | PRN
Start: 2015-01-16 — End: 2015-01-17

## 2015-01-16 NOTE — Care Management Note (Signed)
Case Management Note  Patient Details  Name: Aylanie Cubillos MRN: [id number] Date of Birth: March 04, 1926  Subjective/Objective:                  Pt admitted from home with syncope episode. Pt lives with her husband and will return home at discharge. Pt has a cane and walker but is essentially independent with ADL's. Pt has 4 children who are very active in the care of the pt.  Action/Plan: Awaiting PT eval. Will continue to follow for discharge planning needs.  Expected Discharge Date:                  Expected Discharge Plan:  Home/Self Care  In-House Referral:  NA  Discharge planning Services  CM Consult  Post Acute Care Choice:  NA Choice offered to:  NA  DME Arranged:    DME Agency:     HH Arranged:    HH Agency:     Status of Service:  Completed, signed off  Medicare Important Message Given:    Date Medicare IM Given:    Medicare IM give by:    Date Additional Medicare IM Given:    Additional Medicare Important Message give by:     If discussed at Long Length of Stay Meetings, dates discussed:    Additional Comments:  Cheryl Flash, RN 01/16/2015, 10:29 AM

## 2015-01-16 NOTE — Care Management Obs Status (Signed)
MEDICARE OBSERVATION STATUS NOTIFICATION   Patient Details  Name: Leah Crane MRN: [id number] Date of Birth: December 13, 1926   Medicare Observation Status Notification Given:  Yes    Cheryl Flash, RN 01/16/2015, 10:28 AM

## 2015-01-16 NOTE — Evaluation (Signed)
Physical Therapy Evaluation Patient Details Name: Leah Crane MRN: [id number] DOB: [birth year] Today's Date: 01/16/2015   History of Present Illness  HPI: Leah Crane is a 81 y.o. female with history of HTN, and Arthritis who was brought to the ED after she suffered a syncopal episode at home. Her daughter reports that she had been having constipation and took a lot of Senakot, and it made her have increased stools today and she went tot he bathroom, finished and came out and was walking about and became nauseous and passed out for a few seconds. PEr her daughter she had been havign complaints of nausea and Cramping ABD pain. EMS was called and she was brought to the ED. A CT scan of the ABD was performed and revealed Colitis, and her labs revealed a positive UA, and she was paced on IV Cipro and Flagyl Rx, and referred for admission for a syncope workup.  Clinical Impression  Pt was seen for evaluation.  She was alert and oriented, very cooperative with no c/o.  Daughter present who reports no problems at home.  Pt is found to be at functional baseline.  Her standing balance is somewhat diminished and I have recommended that she use her walker whenever outside of her home.    Follow Up Recommendations No PT follow up    Equipment Recommendations  None recommended by PT    Recommendations for Other Services   none    Precautions / Restrictions Precautions Precautions: None;Fall Restrictions Weight Bearing Restrictions: No      Mobility  Bed Mobility Overal bed mobility: Independent                Transfers Overall transfer level: Independent                  Ambulation/Gait Ambulation/Gait assistance: Modified independent (Device/Increase time) Ambulation Distance (Feet): 160 Feet Assistive device: None Gait Pattern/deviations: Decreased stride length;Decreased dorsiflexion - right;Decreased dorsiflexion - left;Narrow base of support   Gait velocity  interpretation: <1.8 ft/sec, indicative of risk for recurrent falls General Gait Details: no LOB during gait  Stairs            Wheelchair Mobility    Modified Rankin (Stroke Patients Only)       Balance Overall balance assessment: Needs assistance Sitting-balance support: No upper extremity supported;Feet supported Sitting balance-Leahy Scale: Normal     Standing balance support: No upper extremity supported;During functional activity Standing balance-Leahy Scale: Fair                               Pertinent Vitals/Pain Pain Assessment: No/denies pain    Home Living Family/patient expects to be discharged to:: Private residence Living Arrangements: Children;Spouse/significant other Available Help at Discharge: Family Type of Home: House Home Access: Level entry     Home Layout: One level;Laundry or work area in [employer] Equipment: Environmental consultant - 2 wheels;Cane - single point      Prior Function Level of Independence: Independent         Comments: frequently "furniture walks" at home     Hand Dominance        Extremity/Trunk Assessment   Upper Extremity Assessment: Overall WFL for tasks assessed           Lower Extremity Assessment: Overall WFL for tasks assessed      Cervical / Trunk Assessment: Kyphotic  Communication   Communication: HOH  Cognition Arousal/Alertness: Awake/alert Behavior  During Therapy: WFL for tasks assessed/performed Overall Cognitive Status: Within Functional Limits for tasks assessed                      General Comments      Exercises        Assessment/Plan    PT Assessment Patent does not need any further PT services  PT Diagnosis     PT Problem List    PT Treatment Interventions     PT Goals (Current goals can be found in the Care Plan section) Acute Rehab PT Goals PT Goal Formulation: All assessment and education complete, DC therapy    Frequency     Barriers to discharge         Co-evaluation               End of Session Equipment Utilized During Treatment: Gait belt Activity Tolerance: Patient tolerated treatment well Patient left: in bed;with call bell/phone within reach;with family/visitor present      Functional Assessment Tool Used: clinical judgement Functional Limitation: Mobility: Walking and moving around Mobility: Walking and Moving Around Current Status [phone]): 0 percent impaired, limited or restricted Mobility: Walking and Moving Around Goal Status [phone]): 0 percent impaired, limited or restricted Mobility: Walking and Moving Around Discharge Status [phone]): 0 percent impaired, limited or restricted    Time: 4174-0814 PT Time Calculation (min) (ACUTE ONLY): 35 min   Charges:   PT Evaluation $Initial PT Evaluation Tier I: 1 Procedure     PT G Codes:   PT G-Codes **NOT FOR INPATIENT CLASS** Functional Assessment Tool Used: clinical judgement Functional Limitation: Mobility: Walking and moving around Mobility: Walking and Moving Around Current Status ([id number]): 0 percent impaired, limited or restricted Mobility: Walking and Moving Around Goal Status (U3149): 0 percent impaired, limited or restricted Mobility: Walking and Moving Around Discharge Status (F0263): 0 percent impaired, limited or restricted    Konrad Penta  PT 01/16/2015, 11:54 AM [phone]

## 2015-01-16 NOTE — Progress Notes (Addendum)
[facility] PROGRESS NOTE  Leah Crane QQP:[id number] DOB: [birth year] DOA: 01/15/2015 PCP: Olen Cordial, MD  Brief narrative 81 year old female with history of hypertension and arthritis brought to the ED after staining a syncopal episode at home. Patient was constipated for past several days and took some senna code followed which she had several episodes of loose bowel movements. When she came out of the bathroom and walked towards the kitchen she became nauseous and passed out for a few seconds. Daughter was not present at that time but other family members did witness the fall. Patient was also complaining of nausea and cramping abdominal pain on the day of admission. Patient brought to the hospital by EMS. A CT scan of the abdomen done in the ED showed descending colitis. UA was positive for UTI. He was also found to have acute kidney injury and elevated white count. Patient placed on IV Cipro and Flagyl and admitted to hospitalist service.   Assessment/Plan: Syncope Possibly vasovagal versus associated with dehydration. Stable on telemetry. Orthostasis negative. Head CT and cervical spine unremarkable.  Acute descending colitis As seen on CT scan. No further abdominal pain or other GI symptoms. Continue empiric Cipro and Flagyl. Had mild elevated lactate on admission. Will follow-up.   UTI Asymptomatic. Follow cultures. Being covered with ciprofloxacin.  Acute kidney injury Secondary to dehydration. Also on  HCTZ, metolazone and NSAID ( piroxicam) at home. Held all.  Improving with IV fluids  Uncontrolled hypertension Resume felodipine. Holding HCTZ and metolazone given AKA and hypokalemia.. Added hydralazine  Hypokalemia Replenished with KCl.  Constipation Daughter reports this to be ongoing problem and not improved with MiraLAX. I have not added any bowel regimen since patient having loose bowel movement on admission. Would benefit from adding senna and Colace upon  discharge in addition to miralax.  Multiple thyroid nodules Seen incidentally on cervical spine CT. Check TSH and thyroid ultrasound.  ? Left adenoidal adenoma Incidentally seen on abdominal CT. Recommend non-emergent MRI. This can be done as outpatient.  Bilateral renal cysts (indeterminate) Also seen incidentally on CT abdomen without contrast. Follow-up with renal ultrasound  Chronic arthritis Continue plaquenil  and low-dose prednisone.  B12 deficiency  continue supplement  DVT prophylaxis: Subcutaneous heparin   Diet:regular  Code Status: Full code Family Communication: Daughter Debbie at bedside Disposition Plan: Home once improved. PT evaluation.   Consultants:  None  Procedures:  CT head and cervical spine    CT abdomen  Renal ultrasound  Thyroid ultrasound    Antibiotics:  Cipro and Flagyl since 11/27  HPI/Subjective: Patient seen and examined. Denies further abdominal pain or diarrhea.  Objective: Filed Vitals:   01/16/15 0800 01/16/15 1236  BP: 157/51 130/114  Pulse: 79 56  Temp:    Resp:      Intake/Output Summary (Last 24 hours) at 01/16/15 1546 Last data filed at 01/16/15 1526  Gross per 24 hour  Intake 1322.5 ml  Output      4 ml  Net 1318.5 ml   Filed Weights   01/15/15 1649 01/16/15 0014  Weight: 90.719 kg (200 lb) 84.6 kg (186 lb 8.2 oz)    Exam:   General:  Elderly female not in distress, hard of hearing  HEENT: No pallor, moist oral mucosa, supple neck  Chest: Clear to auscultation bilaterally  CVS: Normal S1 and S2, no murmurs rub or gallop  GI: Soft, nondistended, nontender, bowel sounds present  Musculoskeletal warm, no edema neck  CNS: Alert and oriented  Data Reviewed: Basic Metabolic Panel:  Recent Labs Lab 01/15/15 1744 01/16/15 0602  NA 134* 133*  K 3.9 3.0*  CL 96* 100*  CO2 29 26  GLUCOSE 115* 107*  BUN 24* 20  CREATININE 1.91* 1.33*  CALCIUM 9.8 8.7*   Liver Function  Tests:  Recent Labs Lab 01/15/15 1744  AST 26  ALT 17  ALKPHOS 54  BILITOT 1.0  PROT 7.0  ALBUMIN 4.1   No results for input(s): LIPASE, AMYLASE in the last 168 hours. No results for input(s): AMMONIA in the last 168 hours. CBC:  Recent Labs Lab 01/15/15 1744 01/16/15 0602  WBC 14.8* 6.3  NEUTROABS 13.0*  --   HGB 12.6 11.4*  HCT 37.3 33.2*  MCV 93.3 93.3  PLT 273 235   Cardiac Enzymes:  Recent Labs Lab 01/15/15 1744  TROPONINI 0.03   BNP (last 3 results) No results for input(s): BNP in the last 8760 hours.  ProBNP (last 3 results) No results for input(s): PROBNP in the last 8760 hours.  CBG: No results for input(s): GLUCAP in the last 168 hours.  No results found for this or any previous visit (from the past 240 hour(s)).   Studies: Ct Abdomen Pelvis Wo Contrast  01/15/2015  CLINICAL DATA:  Nausea and constipation today with syncope, diffuse cramping and intermittent abdominal pain EXAM: CT ABDOMEN AND PELVIS WITHOUT CONTRAST TECHNIQUE: Multidetector CT imaging of the abdomen and pelvis was performed following the standard protocol without IV contrast. COMPARISON:  01/15/2015 radiographs FINDINGS: Lower chest: Minimal pericardial thickening or fluid. Lung bases clear. Hepatobiliary: 3 low-attenuation lesions in the liver, 1 in the medial left lobe and 2 in the right lobe. These all measure about 2.5-3 cm with average attenuation value consistent with simple fluid. A few tiny hepatic calcifications are likely not of acute consequence. Gallbladder is normal. Pancreas: There is fatty atrophy of the pancreas. Pancreas is otherwise negative. Spleen: Negative Adrenals/Urinary Tract: There is a 2.2 cm left adrenal gland with average attenuation value of 42. Right adrenal gland is normal. Indeterminate exophytic 1.3 cm lesion midpole right kidney. Indeterminate 1.4 cm exophytic lesion midpole left kidney. No stones or hydronephrosis. Bladder is negative. Stomach/Bowel:  Stomach and small bowel are normal. Entire descending colon shows mild surrounding inflammatory change. There is mild diverticulosis of the sigmoid colon without evidence of diverticulitis. Vascular/Lymphatic: Atherosclerotic aortoiliac calcification Reproductive: Uterus not identified.  No pelvic masses Other: No free fluid or air in the abdomen or pelvis Musculoskeletal: no acute findings IMPRESSION: Findings consistent with nonspecific colitis diffusely involving the descending colon Incidental findings consisting of hepatic cysts. Indeterminate left adrenal gland. Possible adenoma. Consider nonemergent MRI to characterize further. Indeterminate bilateral renal lesions possibly cysts but not fully evaluated without contrast. Consider renal ultrasound. Electronically Signed   By: Esperanza Heir M.D.   On: 01/15/2015 21:09   Dg Chest 2 View  01/15/2015  CLINICAL DATA:  Nausea and constipation today with syncope EXAM: CHEST  2 VIEW COMPARISON:  None. FINDINGS: Heart size upper normal. Vascular pattern normal. Lungs clear. No pleural effusions. IMPRESSION: No active cardiopulmonary disease. Electronically Signed   By: Esperanza Heir M.D.   On: 01/15/2015 19:14   Ct Head Wo Contrast  01/15/2015  CLINICAL DATA:  Syncope today at home, fell EXAM: CT HEAD WITHOUT CONTRAST CT CERVICAL SPINE WITHOUT CONTRAST TECHNIQUE: Multidetector CT imaging of the head and cervical spine was performed following the standard protocol without intravenous contrast. Multiplanar CT image reconstructions of the cervical spine  were also generated. COMPARISON:  None. FINDINGS: CT HEAD FINDINGS Diffuse age-related atrophy. No hydrocephalus. No mass infarct hemorrhage or extra-axial fluid. No skull fracture. CT CERVICAL SPINE FINDINGS 2.5 cm heterogeneous left thyroid nodule with calcification. Other smaller nodules bilaterally. Lung apices clear. No acute soft tissue abnormalities. No evidence of cervical spine fracture. Normal  alignment. Significant degenerative change at the atlantodental articulation. Moderate degenerative disc disease throughout the entire cervical spine. No fracture. IMPRESSION: 1. No acute intracranial abnormality 2. Degenerative changes throughout the cervical spine with no fracture 3. Recommend nonemergent ultrasound of the thyroid for multiple nodules. Electronically Signed   By: Esperanza Heir M.D.   On: 01/15/2015 19:10   Ct Cervical Spine Wo Contrast  01/15/2015  CLINICAL DATA:  Syncope today at home, fell EXAM: CT HEAD WITHOUT CONTRAST CT CERVICAL SPINE WITHOUT CONTRAST TECHNIQUE: Multidetector CT imaging of the head and cervical spine was performed following the standard protocol without intravenous contrast. Multiplanar CT image reconstructions of the cervical spine were also generated. COMPARISON:  None. FINDINGS: CT HEAD FINDINGS Diffuse age-related atrophy. No hydrocephalus. No mass infarct hemorrhage or extra-axial fluid. No skull fracture. CT CERVICAL SPINE FINDINGS 2.5 cm heterogeneous left thyroid nodule with calcification. Other smaller nodules bilaterally. Lung apices clear. No acute soft tissue abnormalities. No evidence of cervical spine fracture. Normal alignment. Significant degenerative change at the atlantodental articulation. Moderate degenerative disc disease throughout the entire cervical spine. No fracture. IMPRESSION: 1. No acute intracranial abnormality 2. Degenerative changes throughout the cervical spine with no fracture 3. Recommend nonemergent ultrasound of the thyroid for multiple nodules. Electronically Signed   By: Esperanza Heir M.D.   On: 01/15/2015 19:10   Dg Abd 2 Views  01/15/2015  CLINICAL DATA:  Nausea and constipation. Syncopal episode after exiting the restroom today. EXAM: ABDOMEN - 2 VIEW COMPARISON:  None. FINDINGS: The lung bases are clear.  The heart is enlarged. There is no evidence for obstruction or free air. There does appear to be some thickening about  the distal sigmoid colon. Degenerative changes are noted in the lower lumbar spine and bilateral hips. IMPRESSION: 1. Question thickening about the distal colon. This may represent focal colitis. 2. No evidence for proximal obstruction or free air. 3. Degenerative changes in the lumbar spine and hips. Electronically Signed   By: Marin Roberts M.D.   On: 01/15/2015 19:16    Scheduled Meds: . aspirin EC  81 mg Oral q morning - 10a  . ciprofloxacin  400 mg Intravenous Q24H  . enoxaparin (LOVENOX) injection  30 mg Subcutaneous Q24H  . felodipine  10 mg Oral q morning - 10a  . HYDROcodone-acetaminophen  1 tablet Oral QID  . hydroxychloroquine  200 mg Oral BID  . metronidazole  500 mg Intravenous Q8H  . pantoprazole  40 mg Oral Daily  . potassium chloride SA  20 mEq Oral BID  . pramipexole  0.125 mg Oral QHS  . predniSONE  5 mg Oral q morning - 10a  . sodium chloride  3 mL Intravenous Q12H  . tiZANidine  4 mg Oral TID  . vitamin B-12  1,000 mcg Oral q morning - 10a   Continuous Infusions: . sodium chloride 1,000 mL (01/15/15 1923)  . sodium chloride 75 mL/hr (01/16/15 6759)     Time spent: 25 minutes    Ameyah Bangura  [facility] Pager [phone]. If 7PM-7AM, please contact night-coverage at [email or web address], password [facility] 01/16/2015, 3:46 PM  LOS: 0 days

## 2015-01-17 ENCOUNTER — Inpatient Hospital Stay (HOSPITAL_COMMUNITY): Payer: Medicare Other

## 2015-01-17 DIAGNOSIS — K529 Noninfective gastroenteritis and colitis, unspecified: Secondary | ICD-10-CM

## 2015-01-17 DIAGNOSIS — N179 Acute kidney failure, unspecified: Principal | ICD-10-CM

## 2015-01-17 DIAGNOSIS — N390 Urinary tract infection, site not specified: Secondary | ICD-10-CM

## 2015-01-17 DIAGNOSIS — R55 Syncope and collapse: Secondary | ICD-10-CM

## 2015-01-17 LAB — CBC
HCT: 29.8 % — ABNORMAL LOW (ref 36.0–46.0)
Hemoglobin: 10.1 g/dL — ABNORMAL LOW (ref 12.0–15.0)
MCH: 32 pg (ref 26.0–34.0)
MCHC: 33.9 g/dL (ref 30.0–36.0)
MCV: 94.3 fL (ref 78.0–100.0)
PLATELETS: 185 10*3/uL (ref 150–400)
RBC: 3.16 MIL/uL — AB (ref 3.87–5.11)
RDW: 13 % (ref 11.5–15.5)
WBC: 4.2 10*3/uL (ref 4.0–10.5)

## 2015-01-17 LAB — BASIC METABOLIC PANEL
Anion gap: 5 (ref 5–15)
BUN: 16 mg/dL (ref 6–20)
CHLORIDE: 102 mmol/L (ref 101–111)
CO2: 28 mmol/L (ref 22–32)
CREATININE: 1.06 mg/dL — AB (ref 0.44–1.00)
Calcium: 8.8 mg/dL — ABNORMAL LOW (ref 8.9–10.3)
GFR calc non Af Amer: 46 mL/min — ABNORMAL LOW (ref >60)
GFR, EST AFRICAN AMERICAN: 54 mL/min — AB (ref >60)
Glucose, Bld: 88 mg/dL (ref 65–99)
Potassium: 3.6 mmol/L (ref 3.5–5.1)
Sodium: 135 mmol/L (ref 135–145)

## 2015-01-17 LAB — T4, FREE: Free T4: 0.95 ng/dL (ref 0.61–1.12)

## 2015-01-17 LAB — TSH: TSH: 0.382 u[IU]/mL (ref 0.350–4.500)

## 2015-01-17 MED ORDER — DOCUSATE SODIUM 100 MG PO CAPS: 100.0000 mg | ORAL_CAPSULE | Freq: Two times a day (BID) | ORAL | Status: DC

## 2015-01-17 MED ORDER — METRONIDAZOLE 500 MG PO TABS: 500.0000 mg | ORAL_TABLET | Freq: Three times a day (TID) | ORAL | Status: AC

## 2015-01-17 MED ORDER — CIPROFLOXACIN HCL 250 MG PO TABS
500.0000 mg | ORAL_TABLET | Freq: Two times a day (BID) | ORAL | Status: DC
Start: 2015-01-17 — End: 2015-01-17
  Administered 2015-01-17: 500 mg via ORAL
  Filled 2015-01-17: qty 2

## 2015-01-17 MED ORDER — CIPROFLOXACIN HCL 500 MG PO TABS: 500.0000 mg | ORAL_TABLET | Freq: Two times a day (BID) | ORAL | Status: AC

## 2015-01-17 MED ORDER — METRONIDAZOLE 500 MG PO TABS
500.0000 mg | ORAL_TABLET | Freq: Three times a day (TID) | ORAL | Status: DC
  Administered 2015-01-17: 500 mg via ORAL
  Filled 2015-01-17: qty 1

## 2015-01-17 NOTE — Care Management Note (Signed)
Case Management Note  Patient Details  Name: Leah Crane MRN: [id number] Date of Birth: February 07, 1927  Subjective/Objective:                    Action/Plan:   Expected Discharge Date:                  Expected Discharge Plan:  Home/Self Care  In-House Referral:  NA  Discharge planning Services  CM Consult  Post Acute Care Choice:  NA Choice offered to:  NA  DME Arranged:    DME Agency:     HH Arranged:    HH Agency:     Status of Service:  Completed, signed off  Medicare Important Message Given:  N/A - LOS <3 / Initial given by admissions Date Medicare IM Given:    Medicare IM give by:    Date Additional Medicare IM Given:    Additional Medicare Important Message give by:     If discussed at Long Length of Stay Meetings, dates discussed:    Additional Comments: Pt discharged home today. No CM needs noted. Arlyss Queen [place], RN 01/17/2015, 11:36 AM

## 2015-01-17 NOTE — Care Management Note (Signed)
Case Management Note  Patient Details  Name: Madaleine Simmon MRN: [id number] Date of Birth: [birth year]  Subjective/Objective:                    Action/Plan:   Expected Discharge Date:                  Expected Discharge Plan:  Home/Self Care  In-House Referral:  NA  Discharge planning Services  CM Consult  Post Acute Care Choice:  NA Choice offered to:  NA  DME Arranged:    DME Agency:     HH Arranged:    HH Agency:     Status of Service:  Completed, signed off  Medicare Important Message Given:    Date Medicare IM Given:    Medicare IM give by:    Date Additional Medicare IM Given:    Additional Medicare Important Message give by:     If discussed at Long Length of Stay Meetings, dates discussed:    Additional Comments: Pt discharged home today. No CM needs noted. Arlyss Queen [place], RN 01/17/2015, 11:30 AM

## 2015-01-17 NOTE — Progress Notes (Signed)
Patient ambulated the hallway without any device. Tolerated well. No pain stated and VSS stable. Lesly Dukes, RN

## 2015-01-17 NOTE — Discharge Summary (Signed)
Discharge Summary  Leah Crane ZOX:[id number] DOB: Mar 16, 1926  PCP: Olen Cordial, MD  Admit date: 01/15/2015 Discharge date: 01/17/2015  Time spent: 25 minutes   Recommendations for Outpatient Follow-up:  New medication: Cipro 500 mg by mouth twice a day 3 days New medication: Flagyl 500 mg every 8 hours 3 days New medication: Colace 100 mg by mouth twice a day Patient will keep scheduled PCP appointment on the third week of December. At that time decision will be made on patient's goiter as well as follow-up on thyroid function studies  Discharge Diagnoses:  Active Hospital Problems   Diagnosis Date Noted  . Syncope 01/15/2015  . AKI (acute kidney injury) (HCC) 01/16/2015  . Colitis 01/16/2015  . Urinary tract infectious disease 01/16/2015  . Hypertension 01/16/2015    Resolved Hospital Problems   Diagnosis Date Noted Date Resolved  No resolved problems to display.    Discharge Condition: improved, being discharged home  Diet recommendation: Low sodium   Filed Weights   01/15/15 1649 01/16/15 0014  Weight: 90.719 kg (200 lb) 84.6 kg (186 lb 8.2 oz)    History of present illness:   81 year old female with past oral history of hypertension and issues with significant constipation been ongoing for many months now admitted on 11/27 after syncopal event. Patient had been having difficulty moving her bowels for several days after taking a large amount of medications, was finally able to have several large bowel movements at home. After being on the commode and then standing up, she started from very lightheaded and then passed out. She was brought into the emergency room were she's found to have a UTI as well as colitis.   Hospital Course:  Principal Problem:   Syncope: Looked to be vasovagal versus dehydration secondary UTI. Workup negative. Head CT unremarkable. Patient back to baseline. Active Problems:   AKI (acute kidney injury) (HCC) : secondary to UTI. Improved  with hydration and back to near normal by day of discharge.    Colitis: Patient started on Cipro and Flagyl and this will be continued upon discharge   Urinary tract infection: Already on Cipro. Cultures pending    Hypertension: Antihypertensives held on admission. On hydration, blood pressure in the 160s. Okay to resume diuretic antihypertensives on discharge.  Thyroid cysts/goiter: Thyroid cysts noted on head CT. Ultrasound notes presence of large goiter with area large enough for biopsy.after discussion with patient's daughter and patient, they opt for checking thyroid function levels and then could continue to monitor and recheck thyroid ultrasound in 6 months. TSH ordered which is normal. Free T4 and free T3 ordered and are pending     Renal cysts: Noted on abdominal CT: Renal chest on checked and found to be unrevealing  Procedures:  None   Consultations:  None    Discharge Exam: BP 161/69 mmHg  Pulse 70  Temp(Src) 97.7 F (36.5 C) (Oral)  Resp 20  Ht  (1.702 m)  Wt 84.6 kg (186 lb 8.2 oz)  BMI 29.20 kg/m2  SpO2 97%  General: alert and oriented 3, no acute distress  Cardiovascular: regular rate and rhythm, S1 and S2   Respiratory: clear to auscultation bilaterally    Discharge Instructions You were cared for by a hospitalist during your hospital stay. If you have any questions about your discharge medications or the care you received while you were in the hospital after you are discharged, you can call the unit and asked to speak with the hospitalist on call if  the hospitalist that took care of you is not available. Once you are discharged, your primary care physician will handle any further medical issues. Please note that NO REFILLS for any discharge medications will be authorized once you are discharged, as it is imperative that you return to your primary care physician (or establish a relationship with a primary care physician if you do not have one) for your  aftercare needs so that they can reassess your need for medications and monitor your lab values.  Discharge Instructions    Diet - low sodium heart healthy    Complete by:  As directed      Increase activity slowly    Complete by:  As directed             Medication List    TAKE these medications        aspirin EC 81 MG tablet  Take 81 mg by mouth every morning.     calcium-vitamin D 500-200 MG-UNIT tablet  Commonly known as:  OSCAL WITH D  Take 1 tablet by mouth 2 (two) times daily.     ciprofloxacin 500 MG tablet  Commonly known as:  CIPRO  Take 1 tablet (500 mg total) by mouth 2 (two) times daily.     docusate sodium 100 MG capsule  Commonly known as:  COLACE  Take 1 capsule (100 mg total) by mouth 2 (two) times daily.     felodipine 10 MG 24 hr tablet  Commonly known as:  PLENDIL  Take 10 mg by mouth every morning.     furosemide 40 MG tablet  Commonly known as:  LASIX  Take 80 mg by mouth daily as needed for fluid or edema.     hydrochlorothiazide 25 MG tablet  Commonly known as:  HYDRODIURIL  Take 25 mg by mouth every morning.     HYDROcodone-acetaminophen 10-325 MG tablet  Commonly known as:  NORCO  Take 1 tablet by mouth 4 (four) times daily.     hydroxychloroquine 200 MG tablet  Commonly known as:  PLAQUENIL  Take 200 mg by mouth 2 (two) times daily.     metolazone 2.5 MG tablet  Commonly known as:  ZAROXOLYN  Take 2.5 mg by mouth every Monday, Wednesday, and Friday.     metroNIDAZOLE 500 MG tablet  Commonly known as:  FLAGYL  Take 1 tablet (500 mg total) by mouth every 8 (eight) hours.     omeprazole 20 MG capsule  Commonly known as:  PRILOSEC  Take 20 mg by mouth every morning.     piroxicam 10 MG capsule  Commonly known as:  FELDENE  Take 10 mg by mouth every morning.     potassium chloride SA 20 MEQ tablet  Commonly known as:  K-DUR,KLOR-CON  Take 20 mEq by mouth 2 (two) times daily.     pramipexole 0.125 MG tablet  Commonly known  as:  MIRAPEX  Take 0.125 mg by mouth at bedtime.     predniSONE 5 MG tablet  Commonly known as:  DELTASONE  Take 5 mg by mouth every morning.     quinapril 40 MG tablet  Commonly known as:  ACCUPRIL  Take 40 mg by mouth 2 (two) times daily.     tiZANidine 4 MG tablet  Commonly known as:  ZANAFLEX  Take 4 mg by mouth 3 (three) times daily.     vitamin B-12 1000 MCG tablet  Commonly known as:  CYANOCOBALAMIN  Take 1,000 mcg by  mouth every morning.       No Known Allergies     Follow-up Information    Follow up with Olen Cordial, MD On 01/24/2015.   Specialty:  Family Medicine   Why:  at 8:30 am   Contact information:   [address] [place] [address] [phone]        The results of significant diagnostics from this hospitalization (including imaging, microbiology, ancillary and laboratory) are listed below for reference.    Significant Diagnostic Studies: Ct Abdomen Pelvis Wo Contrast  01/15/2015  CLINICAL DATA:  Nausea and constipation today with syncope, diffuse cramping and intermittent abdominal pain EXAM: CT ABDOMEN AND PELVIS WITHOUT CONTRAST TECHNIQUE: Multidetector CT imaging of the abdomen and pelvis was performed following the standard protocol without IV contrast. COMPARISON:  01/15/2015 radiographs FINDINGS: Lower chest: Minimal pericardial thickening or fluid. Lung bases clear. Hepatobiliary: 3 low-attenuation lesions in the liver, 1 in the medial left lobe and 2 in the right lobe. These all measure about 2.5-3 cm with average attenuation value consistent with simple fluid. A few tiny hepatic calcifications are likely not of acute consequence. Gallbladder is normal. Pancreas: There is fatty atrophy of the pancreas. Pancreas is otherwise negative. Spleen: Negative Adrenals/Urinary Tract: There is a 2.2 cm left adrenal gland with average attenuation value of 42. Right adrenal gland is normal. Indeterminate exophytic 1.3 cm lesion midpole right kidney.  Indeterminate 1.4 cm exophytic lesion midpole left kidney. No stones or hydronephrosis. Bladder is negative. Stomach/Bowel: Stomach and small bowel are normal. Entire descending colon shows mild surrounding inflammatory change. There is mild diverticulosis of the sigmoid colon without evidence of diverticulitis. Vascular/Lymphatic: Atherosclerotic aortoiliac calcification Reproductive: Uterus not identified.  No pelvic masses Other: No free fluid or air in the abdomen or pelvis Musculoskeletal: no acute findings IMPRESSION: Findings consistent with nonspecific colitis diffusely involving the descending colon Incidental findings consisting of hepatic cysts. Indeterminate left adrenal gland. Possible adenoma. Consider nonemergent MRI to characterize further. Indeterminate bilateral renal lesions possibly cysts but not fully evaluated without contrast. Consider renal ultrasound. Electronically Signed   By: Esperanza Heir M.D.   On: 01/15/2015 21:09   Dg Chest 2 View  01/15/2015  CLINICAL DATA:  Nausea and constipation today with syncope EXAM: CHEST  2 VIEW COMPARISON:  None. FINDINGS: Heart size upper normal. Vascular pattern normal. Lungs clear. No pleural effusions. IMPRESSION: No active cardiopulmonary disease. Electronically Signed   By: Esperanza Heir M.D.   On: 01/15/2015 19:14   Ct Head Wo Contrast  01/15/2015  CLINICAL DATA:  Syncope today at home, fell EXAM: CT HEAD WITHOUT CONTRAST CT CERVICAL SPINE WITHOUT CONTRAST TECHNIQUE: Multidetector CT imaging of the head and cervical spine was performed following the standard protocol without intravenous contrast. Multiplanar CT image reconstructions of the cervical spine were also generated. COMPARISON:  None. FINDINGS: CT HEAD FINDINGS Diffuse age-related atrophy. No hydrocephalus. No mass infarct hemorrhage or extra-axial fluid. No skull fracture. CT CERVICAL SPINE FINDINGS 2.5 cm heterogeneous left thyroid nodule with calcification. Other smaller  nodules bilaterally. Lung apices clear. No acute soft tissue abnormalities. No evidence of cervical spine fracture. Normal alignment. Significant degenerative change at the atlantodental articulation. Moderate degenerative disc disease throughout the entire cervical spine. No fracture. IMPRESSION: 1. No acute intracranial abnormality 2. Degenerative changes throughout the cervical spine with no fracture 3. Recommend nonemergent ultrasound of the thyroid for multiple nodules. Electronically Signed   By: Esperanza Heir M.D.   On: 01/15/2015 19:10   Ct Cervical  Spine Wo Contrast  01/15/2015  CLINICAL DATA:  Syncope today at home, fell EXAM: CT HEAD WITHOUT CONTRAST CT CERVICAL SPINE WITHOUT CONTRAST TECHNIQUE: Multidetector CT imaging of the head and cervical spine was performed following the standard protocol without intravenous contrast. Multiplanar CT image reconstructions of the cervical spine were also generated. COMPARISON:  None. FINDINGS: CT HEAD FINDINGS Diffuse age-related atrophy. No hydrocephalus. No mass infarct hemorrhage or extra-axial fluid. No skull fracture. CT CERVICAL SPINE FINDINGS 2.5 cm heterogeneous left thyroid nodule with calcification. Other smaller nodules bilaterally. Lung apices clear. No acute soft tissue abnormalities. No evidence of cervical spine fracture. Normal alignment. Significant degenerative change at the atlantodental articulation. Moderate degenerative disc disease throughout the entire cervical spine. No fracture. IMPRESSION: 1. No acute intracranial abnormality 2. Degenerative changes throughout the cervical spine with no fracture 3. Recommend nonemergent ultrasound of the thyroid for multiple nodules. Electronically Signed   By: Esperanza Heir M.D.   On: 01/15/2015 19:10   US Soft Tissue Head/neck  01/17/2015  CLINICAL DATA:  Multinodular thyroid gland detected by a recent CT of the cervical spine. EXAM: THYROID ULTRASOUND TECHNIQUE: Ultrasound examination of the  thyroid gland and adjacent soft tissues was performed. COMPARISON:  CT of the cervical spine on 01/15/2015 FINDINGS: Right thyroid lobe Measurements: 4.9 x 1.8 x 1.5 cm. The right lobe is heterogeneous in echotexture. Solid nodule in the inferior right lobe measures approximately 1.4 x 1.0 x 1.5 cm. This nodule is noncalcified. Adjacent smaller partially solid and partially cystic nodule measures approximately 0.8 x 0.6 x 0.6 cm. Another adjacent partially solid and partially cystic nodule measures 0.6 cm. Left thyroid lobe Measurements: 5.3 x 2.3 x 1.9 cm. Left lobe echotexture is also heterogeneous. Partially solid and partially cystic nodule in the inferior left lobe measures approximately 2.6 x 1.7 x 1.6 cm. This nodule is noncalcified. Isthmus Thickness: 1 cm.  No nodules visualized. Lymphadenopathy None visualized. IMPRESSION: Thyroid goiter with mild thyroid enlargement, left greater than right, and heterogeneous parenchymal echotexture. Several nodules are identified with a dominant solid and cystic nodule in the inferior left lobe measuring 2.6 cm in greatest diameter. This does meet size criteria for biopsy. However, given the presence of other nodules including a 1.5 cm nodule in the inferior right lobe, it would also be reasonable to follow thyroid nodules by ultrasound (6-12 months) prior to making a determination about biopsy given the patient's age. Electronically Signed   By: Irish Lack M.D.   On: 01/17/2015 09:38   US Renal  01/17/2015  CLINICAL DATA:  81 year old patient with acute renal failure. Renal cysts seen on CT. EXAM: RENAL / URINARY TRACT ULTRASOUND COMPLETE COMPARISON:  CT abdomen pelvis 01/15/2015 FINDINGS: Right Kidney: Length: 9.7 cm. The echogenicity of the cortex is diffusely increased and there are areas of cortical thinning at the poles. Negative for hydronephrosis. There is an exophytic 1.6 x 1.3 x 1.5 cm midpole simple cyst. Left Kidney: Length: 11.8 cm in length.  Echogenicity of the renal cortex is diffusely increased. There are focal areas of thinning of the cortex particular at the poles. Negative for hydronephrosis. There is an exophytic superior pole simple cyst measuring 1.5 x 1.3 x 1.4 cm. Bladder: Appears normal for degree of bladder distention. IMPRESSION: 1. Negative for hydronephrosis. 2. Increased echogenicity and thinning of the renal cortices bilaterally suggests chronic medical renal disease. 3. Each kidney contains a single simple cyst, the largest measuring 1.6 cm on the right. Electronically Signed   By: Darl Pikes  Turner M.D.   On: 01/17/2015 09:37   Dg Abd 2 Views  01/15/2015  CLINICAL DATA:  Nausea and constipation. Syncopal episode after exiting the restroom today. EXAM: ABDOMEN - 2 VIEW COMPARISON:  None. FINDINGS: The lung bases are clear.  The heart is enlarged. There is no evidence for obstruction or free air. There does appear to be some thickening about the distal sigmoid colon. Degenerative changes are noted in the lower lumbar spine and bilateral hips. IMPRESSION: 1. Question thickening about the distal colon. This may represent focal colitis. 2. No evidence for proximal obstruction or free air. 3. Degenerative changes in the lumbar spine and hips. Electronically Signed   By: Marin Roberts M.D.   On: 01/15/2015 19:16    Microbiology: No results found for this or any previous visit (from the past 240 hour(s)).   Labs: Basic Metabolic Panel:  Recent Labs Lab 01/15/15 1744 01/16/15 0602 01/17/15 0652  NA 134* 133* 135  K 3.9 3.0* 3.6  CL 96* 100* 102  CO2 29 26 28   GLUCOSE 115* 107* 88  BUN 24* 20 16  CREATININE 1.91* 1.33* 1.06*  CALCIUM 9.8 8.7* 8.8*   Liver Function Tests:  Recent Labs Lab 01/15/15 1744  AST 26  ALT 17  ALKPHOS 54  BILITOT 1.0  PROT 7.0  ALBUMIN 4.1   No results for input(s): LIPASE, AMYLASE in the last 168 hours. No results for input(s): AMMONIA in the last 168 hours. CBC:  Recent  Labs Lab 01/15/15 1744 01/16/15 0602 01/17/15 0652  WBC 14.8* 6.3 4.2  NEUTROABS 13.0*  --   --   HGB 12.6 11.4* 10.1*  HCT 37.3 33.2* 29.8*  MCV 93.3 93.3 94.3  PLT 273 235 185   Cardiac Enzymes:  Recent Labs Lab 01/15/15 1744  TROPONINI 0.03   BNP: BNP (last 3 results) No results for input(s): BNP in the last 8760 hours.  ProBNP (last 3 results) No results for input(s): PROBNP in the last 8760 hours.  CBG: No results for input(s): GLUCAP in the last 168 hours.     Signed:  01/17/15  [facility] 01/17/2015, 4:07 PM

## 2015-01-17 NOTE — Care Management Important Message (Signed)
Important Message  Patient Details  Name: Leah Crane MRN: [id number] Date of Birth: 28-May-1926   Medicare Important Message Given:  N/A - LOS <3 / Initial given by admissions    Cheryl Flash, RN 01/17/2015, 11:35 AM

## 2015-01-17 NOTE — Progress Notes (Signed)
Discharged PT per MD order and protocol. Reviewed discharge teaching and handouts given. Pt verbalized understanding and left with all belongings. VSS. IV catheter D/C.  Patient wheeled down by staff member. Rosezella Kronick J Everett, RN  

## 2015-01-17 NOTE — Progress Notes (Signed)
PT Cancellation Note  Patient Details Name: Leah Crane MRN: [id number] DOB: Oct 21, 1926   Cancelled Treatment:    Reason Eval/Treat Not Completed: Other (comment).  We received a consult to do a PT evaluation.  She was actually seen yesterday, 01-16-15 and found to be at functional baseline.  No further PT is needed.  If there are new concerns that need to be addressed, please let me know.   Myrlene Broker L  PT 01/17/2015, 10:00 AM [phone]

## 2015-01-18 LAB — T3, FREE: T3, Free: 2.1 pg/mL (ref 2.0–4.4)

## 2015-09-19 DEATH — deceased

## 2015-12-30 ENCOUNTER — Encounter (HOSPITAL_COMMUNITY): Payer: Self-pay | Admitting: Emergency Medicine

## 2015-12-30 ENCOUNTER — Emergency Department (HOSPITAL_COMMUNITY): Payer: Medicare Other

## 2015-12-30 ENCOUNTER — Observation Stay (HOSPITAL_COMMUNITY)
Admission: EM | Admit: 2015-12-30 | Discharge: 2015-12-31 | Disposition: A | Discharge status: Home / Self Care | Payer: Medicare Other | Attending: Family Medicine | Admitting: Family Medicine

## 2015-12-30 DIAGNOSIS — Z79899 Other long term (current) drug therapy: Secondary | ICD-10-CM | POA: Diagnosis not present

## 2015-12-30 DIAGNOSIS — K219 Gastro-esophageal reflux disease without esophagitis: Secondary | ICD-10-CM | POA: Diagnosis present

## 2015-12-30 DIAGNOSIS — R079 Chest pain, unspecified: Principal | ICD-10-CM | POA: Diagnosis present

## 2015-12-30 DIAGNOSIS — R072 Precordial pain: Secondary | ICD-10-CM | POA: Diagnosis present

## 2015-12-30 DIAGNOSIS — I10 Essential (primary) hypertension: Secondary | ICD-10-CM | POA: Diagnosis not present

## 2015-12-30 DIAGNOSIS — Z7982 Long term (current) use of aspirin: Secondary | ICD-10-CM | POA: Diagnosis not present

## 2015-12-30 DIAGNOSIS — R109 Unspecified abdominal pain: Secondary | ICD-10-CM | POA: Diagnosis not present

## 2015-12-30 DIAGNOSIS — Z8719 Personal history of other diseases of the digestive system: Secondary | ICD-10-CM

## 2015-12-30 HISTORY — DX: Nontoxic multinodular goiter: E04.2

## 2015-12-30 HISTORY — DX: Personal history of other diseases of the digestive system: Z87.19

## 2015-12-30 LAB — CBC WITH DIFFERENTIAL/PLATELET
BASOS ABS: 0 10*3/uL (ref 0.0–0.1)
BASOS PCT: 1 %
EOS ABS: 0.2 10*3/uL (ref 0.0–0.7)
EOS PCT: 3 %
HCT: 33.7 % — ABNORMAL LOW (ref 36.0–46.0)
Hemoglobin: 11.1 g/dL — ABNORMAL LOW (ref 12.0–15.0)
Lymphocytes Relative: 19 %
Lymphs Abs: 1.1 10*3/uL (ref 0.7–4.0)
MCH: 31.2 pg (ref 26.0–34.0)
MCHC: 32.9 g/dL (ref 30.0–36.0)
MCV: 94.7 fL (ref 78.0–100.0)
MONO ABS: 0.5 10*3/uL (ref 0.1–1.0)
MONOS PCT: 8 %
NEUTROS ABS: 4.1 10*3/uL (ref 1.7–7.7)
Neutrophils Relative %: 69 %
PLATELETS: 257 10*3/uL (ref 150–400)
RBC: 3.56 MIL/uL — ABNORMAL LOW (ref 3.87–5.11)
RDW: 13.1 % (ref 11.5–15.5)
WBC: 5.9 10*3/uL (ref 4.0–10.5)

## 2015-12-30 LAB — COMPREHENSIVE METABOLIC PANEL
ALBUMIN: 3.7 g/dL (ref 3.5–5.0)
ALT: 13 U/L — ABNORMAL LOW (ref 14–54)
ANION GAP: 7 (ref 5–15)
AST: 21 U/L (ref 15–41)
Alkaline Phosphatase: 65 U/L (ref 38–126)
BILIRUBIN TOTAL: 0.5 mg/dL (ref 0.3–1.2)
BUN: 14 mg/dL (ref 6–20)
CHLORIDE: 101 mmol/L (ref 101–111)
CO2: 28 mmol/L (ref 22–32)
Calcium: 9.4 mg/dL (ref 8.9–10.3)
Creatinine, Ser: 1.01 mg/dL — ABNORMAL HIGH (ref 0.44–1.00)
GFR calc Af Amer: 56 mL/min — ABNORMAL LOW (ref >60)
GFR calc non Af Amer: 49 mL/min — ABNORMAL LOW (ref >60)
GLUCOSE: 89 mg/dL (ref 65–99)
POTASSIUM: 3.7 mmol/L (ref 3.5–5.1)
SODIUM: 136 mmol/L (ref 135–145)
TOTAL PROTEIN: 6.6 g/dL (ref 6.5–8.1)

## 2015-12-30 LAB — URINALYSIS, ROUTINE W REFLEX MICROSCOPIC
Bilirubin Urine: NEGATIVE
GLUCOSE, UA: NEGATIVE mg/dL
Hgb urine dipstick: NEGATIVE
KETONES UR: NEGATIVE mg/dL
LEUKOCYTES UA: NEGATIVE
NITRITE: NEGATIVE
PH: 6 (ref 5.0–8.0)
PROTEIN: NEGATIVE mg/dL
Specific Gravity, Urine: 1.01 (ref 1.005–1.030)

## 2015-12-30 LAB — TROPONIN I: Troponin I: 0.03 ng/mL (ref <0.03)

## 2015-12-30 MED ORDER — PANTOPRAZOLE SODIUM 40 MG PO TBEC
40.0000 mg | DELAYED_RELEASE_TABLET | Freq: Two times a day (BID) | ORAL | Status: DC
  Administered 2015-12-30 – 2015-12-31 (×2): 40 mg via ORAL
  Filled 2015-12-30 (×2): qty 1

## 2015-12-30 MED ORDER — PIROXICAM 10 MG PO CAPS
10.0000 mg | ORAL_CAPSULE | Freq: Every morning | ORAL | Status: DC
  Administered 2015-12-31: 10 mg via ORAL
  Filled 2015-12-30 (×2): qty 1

## 2015-12-30 MED ORDER — ACETAMINOPHEN 325 MG PO TABS: 650.0000 mg | ORAL_TABLET | ORAL | Status: DC | PRN

## 2015-12-30 MED ORDER — DOCUSATE SODIUM 100 MG PO CAPS
100.0000 mg | ORAL_CAPSULE | Freq: Every evening | ORAL | Status: DC
  Administered 2015-12-30: 100 mg via ORAL
  Filled 2015-12-30: qty 1

## 2015-12-30 MED ORDER — FAMOTIDINE 20 MG PO TABS
20.0000 mg | ORAL_TABLET | Freq: Two times a day (BID) | ORAL | Status: DC
  Administered 2015-12-30 – 2015-12-31 (×2): 20 mg via ORAL
  Filled 2015-12-30 (×2): qty 1

## 2015-12-30 MED ORDER — MORPHINE SULFATE (PF) 2 MG/ML IV SOLN: 2.0000 mg | INTRAVENOUS | Status: DC | PRN

## 2015-12-30 MED ORDER — ONDANSETRON HCL 4 MG/2ML IJ SOLN: 4.0000 mg | Freq: Four times a day (QID) | INTRAMUSCULAR | Status: DC | PRN

## 2015-12-30 MED ORDER — PRAMIPEXOLE DIHYDROCHLORIDE 0.25 MG PO TABS
0.1250 mg | ORAL_TABLET | Freq: Every day | ORAL | Status: DC
  Filled 2015-12-30: qty 1

## 2015-12-30 MED ORDER — TIZANIDINE HCL 4 MG PO TABS
4.0000 mg | ORAL_TABLET | Freq: Two times a day (BID) | ORAL | Status: DC
  Administered 2015-12-30 – 2015-12-31 (×2): 4 mg via ORAL
  Filled 2015-12-30 (×2): qty 1

## 2015-12-30 MED ORDER — FELODIPINE ER 5 MG PO TB24
10.0000 mg | ORAL_TABLET | Freq: Every morning | ORAL | Status: DC
  Administered 2015-12-31: 10 mg via ORAL
  Filled 2015-12-30 (×2): qty 1

## 2015-12-30 MED ORDER — GI COCKTAIL ~~LOC~~
30.0000 mL | Freq: Once | ORAL | Status: AC
  Administered 2015-12-30: 30 mL via ORAL
  Filled 2015-12-30: qty 30

## 2015-12-30 MED ORDER — DOCUSATE SODIUM 100 MG PO CAPS: 100.0000 mg | ORAL_CAPSULE | Freq: Two times a day (BID) | ORAL | Status: DC

## 2015-12-30 MED ORDER — HYDROCODONE-ACETAMINOPHEN 10-325 MG PO TABS
1.0000 | ORAL_TABLET | Freq: Four times a day (QID) | ORAL | Status: DC | PRN
  Administered 2015-12-30: 1 via ORAL
  Filled 2015-12-30: qty 1

## 2015-12-30 MED ORDER — QUINAPRIL HCL 10 MG PO TABS
40.0000 mg | ORAL_TABLET | Freq: Two times a day (BID) | ORAL | Status: DC
  Filled 2015-12-30 (×4): qty 4

## 2015-12-30 MED ORDER — ASPIRIN 325 MG PO TABS
325.0000 mg | ORAL_TABLET | Freq: Once | ORAL | Status: AC
  Administered 2015-12-30: 325 mg via ORAL
  Filled 2015-12-30: qty 1

## 2015-12-30 MED ORDER — HYDROCHLOROTHIAZIDE 25 MG PO TABS
25.0000 mg | ORAL_TABLET | Freq: Every morning | ORAL | Status: DC
  Administered 2015-12-31: 25 mg via ORAL
  Filled 2015-12-30: qty 1

## 2015-12-30 MED ORDER — ASPIRIN EC 81 MG PO TBEC
81.0000 mg | DELAYED_RELEASE_TABLET | ORAL | Status: DC
  Administered 2015-12-31: 81 mg via ORAL
  Filled 2015-12-30: qty 1

## 2015-12-30 MED ORDER — DOCUSATE SODIUM 100 MG PO CAPS
200.0000 mg | ORAL_CAPSULE | Freq: Every day | ORAL | Status: DC
  Administered 2015-12-31: 200 mg via ORAL
  Filled 2015-12-30: qty 2

## 2015-12-30 MED ORDER — ENSURE ENLIVE PO LIQD
237.0000 mL | Freq: Two times a day (BID) | ORAL | Status: DC
  Administered 2015-12-31: 237 mL via ORAL

## 2015-12-30 MED ORDER — FUROSEMIDE 80 MG PO TABS: 80.0000 mg | ORAL_TABLET | ORAL | Status: DC

## 2015-12-30 MED ORDER — ENOXAPARIN SODIUM 40 MG/0.4ML ~~LOC~~ SOLN
40.0000 mg | SUBCUTANEOUS | Status: DC
  Administered 2015-12-30: 40 mg via SUBCUTANEOUS
  Filled 2015-12-30: qty 0.4

## 2015-12-30 MED ORDER — NITROGLYCERIN 0.4 MG SL SUBL: 0.4000 mg | SUBLINGUAL_TABLET | SUBLINGUAL | Status: DC | PRN

## 2015-12-30 MED ORDER — PREDNISONE 10 MG PO TABS
5.0000 mg | ORAL_TABLET | Freq: Every morning | ORAL | Status: DC
  Administered 2015-12-31: 5 mg via ORAL
  Filled 2015-12-30: qty 1

## 2015-12-30 MED ORDER — HYDROXYCHLOROQUINE SULFATE 200 MG PO TABS
200.0000 mg | ORAL_TABLET | Freq: Every morning | ORAL | Status: DC
  Administered 2015-12-31: 200 mg via ORAL
  Filled 2015-12-30 (×2): qty 1

## 2015-12-30 MED ORDER — METOLAZONE 5 MG PO TABS: 2.5000 mg | ORAL_TABLET | ORAL | Status: DC

## 2015-12-30 MED ORDER — POTASSIUM CHLORIDE CRYS ER 20 MEQ PO TBCR
20.0000 meq | EXTENDED_RELEASE_TABLET | Freq: Every morning | ORAL | Status: DC
  Administered 2015-12-31: 20 meq via ORAL
  Filled 2015-12-30: qty 1

## 2015-12-30 NOTE — ED Provider Notes (Signed)
[facility]Y DEPT Provider Note   CSN: [id number] Arrival date & time: 12/30/15  1632     History   Chief Complaint Chief Complaint  Patient presents with  . Chest Pain    epigastric region    HPI Leah Crane is a 82 y.o. female.   Chest Pain   This is a new problem. The current episode started 6 to 12 hours ago. The problem occurs constantly. The pain is present in the substernal region. The pain is at a severity of 8/10. The pain is moderate. The quality of the pain is described as brief and sharp. The pain does not radiate. The symptoms are aggravated by deep breathing (swallowing). Associated symptoms include abdominal pain. Pertinent negatives include no cough. She has tried nothing for the symptoms. The treatment provided no relief.    Past Medical History:  Diagnosis Date  . Arthritis   . Chronic constipation   . GERD (gastroesophageal reflux disease)   . Hypertension   . Hypokalemia   . Multiple thyroid nodules   . Peripheral edema   . Restless leg     Patient Active Problem List   Diagnosis Date Noted  . Chest pain 12/30/2015  . AKI (acute kidney injury) (HCC) 01/16/2015  . Colitis 01/16/2015  . Urinary tract infectious disease 01/16/2015  . Hypertension 01/16/2015  . Syncope 01/15/2015    Past Surgical History:  Procedure Laterality Date  . HIATAL HERNIA REPAIR    . REPLACEMENT TOTAL KNEE      OB History    No data available       Home Medications    Prior to Admission medications   Medication Sig Start Date End Date Taking? Authorizing Provider  aspirin EC 81 MG tablet Take 81 mg by mouth 4 (four) times a week. MWF, and Sundays   Yes Historical Provider, MD  Calcium Carb-Cholecalciferol (CALCIUM 600+D) 600-800 MG-UNIT TABS Take 1 tablet by mouth 2 (two) times daily.   Yes Historical Provider, MD  docusate sodium (COLACE) 100 MG capsule Take 1 capsule (100 mg total) by mouth 2 (two) times daily. Patient taking differently: Take 100-200  mg by mouth 2 (two) times daily. 2 in the morning and 1 in the evening at 6pm 01/17/15  Yes Hollice Espy, MD  felodipine (PLENDIL) 10 MG 24 hr tablet Take 10 mg by mouth every morning.  01/05/15  Yes Historical Provider, MD  furosemide (LASIX) 40 MG tablet Take 80 mg by mouth 3 (three) times a week. Tuesdays, Thursdays, and Saturdays, depends on fluid   Yes Historical Provider, MD  hydrochlorothiazide (HYDRODIURIL) 25 MG tablet Take 25 mg by mouth every morning.   Yes Historical Provider, MD  HYDROcodone-acetaminophen (NORCO) 10-325 MG tablet Take 1 tablet by mouth 4 (four) times daily as needed for moderate pain or severe pain.    Yes Historical Provider, MD  hydroxychloroquine (PLAQUENIL) 200 MG tablet Take 200 mg by mouth every morning.    Yes Historical Provider, MD  metolazone (ZAROXOLYN) 2.5 MG tablet Take 2.5 mg by mouth every Monday, Wednesday, and Friday.   Yes Historical Provider, MD  omeprazole (PRILOSEC) 20 MG capsule Take 20 mg by mouth every morning.   Yes Historical Provider, MD  piroxicam (FELDENE) 10 MG capsule Take 10 mg by mouth every morning.   Yes Historical Provider, MD  potassium chloride SA (K-DUR,KLOR-CON) 20 MEQ tablet Take 20 mEq by mouth every morning.    Yes Historical Provider, MD  pramipexole (MIRAPEX) 0.125 MG tablet  Take 0.125 mg by mouth daily at 6 PM.    Yes Historical Provider, MD  predniSONE (DELTASONE) 5 MG tablet Take 5 mg by mouth every morning.   Yes Historical Provider, MD  quinapril (ACCUPRIL) 40 MG tablet Take 40 mg by mouth 2 (two) times daily.   Yes Historical Provider, MD  ranitidine (ZANTAC) 150 MG tablet Take 150 mg by mouth daily at 6 PM.   Yes Historical Provider, MD  tiZANidine (ZANAFLEX) 4 MG tablet Take 4 mg by mouth 2 (two) times daily. Morning and at bedtime. *prescribed three times daily   Yes Historical Provider, MD  vitamin B-12 (CYANOCOBALAMIN) 1000 MCG tablet Take 1,000 mcg by mouth every morning.   Yes Historical Provider, MD     Family History No family history on file.  Social History Social History  Substance Use Topics  . Smoking status: Never Smoker  . Smokeless tobacco: Never Used  . Alcohol use No     Allergies   Patient has no known allergies.   Review of Systems Review of Systems  Respiratory: Negative for cough, choking and chest tightness.   Cardiovascular: Positive for chest pain.  Gastrointestinal: Positive for abdominal pain.  All other systems reviewed and are negative.    Physical Exam Updated Vital Signs BP 177/68   Pulse 69   Resp 21   SpO2 98%   Physical Exam  Constitutional: She is oriented to person, place, and time. She appears well-developed and well-nourished.  HENT:  Head: Normocephalic and atraumatic.  Eyes: Conjunctivae and EOM are normal.  Neck: Normal range of motion.  Cardiovascular: Normal rate and regular rhythm.   No murmur heard. Pulmonary/Chest: Effort normal and breath sounds normal. No stridor. No respiratory distress.  Abdominal: Soft. She exhibits no distension. There is no tenderness.  Musculoskeletal: Normal range of motion. She exhibits no edema or deformity.  Neurological: She is alert and oriented to person, place, and time.  Skin: Skin is warm and dry.  Nursing note and vitals reviewed.    ED Treatments / Results  Labs (all labs ordered are listed, but only abnormal results are displayed) Labs Reviewed  CBC WITH DIFFERENTIAL/PLATELET - Abnormal; Notable for the following:       Result Value   RBC 3.56 (*)    Hemoglobin 11.1 (*)    HCT 33.7 (*)    All other components within normal limits  COMPREHENSIVE METABOLIC PANEL - Abnormal; Notable for the following:    Creatinine, Ser 1.01 (*)    ALT 13 (*)    GFR calc non Af Amer 49 (*)    GFR calc Af Amer 56 (*)    All other components within normal limits  TROPONIN I - Abnormal; Notable for the following:    Troponin I 0.03 (*)    All other components within normal limits  URINE  CULTURE  URINALYSIS, ROUTINE W REFLEX MICROSCOPIC (NOT AT [facility])    EKG  EKG Interpretation  Date/Time:  Saturday December 30 2015 16:39:11 EST Ventricular Rate:  70 PR Interval:  180 QRS Duration: 90 QT Interval:  438 QTC Calculation: 473 R Axis:   2 Text Interpretation:  Normal sinus rhythm Normal ECG No significant change since last tracing Confirmed by [facility] MD, Barbara Cower [phone]) on 12/30/2015 5:29:10 PM       Radiology Dg Chest 2 View  Result Date: 12/30/2015 CLINICAL DATA:  Patient feels like something is stuck in the midesophagus. EXAM: CHEST  2 VIEW COMPARISON:  None. FINDINGS:  Lungs are hyperexpanded. The lungs are clear wiithout focal pneumonia, edema, pneumothorax or pleural effusion. Interstitial markings are diffusely coarsened with chronic features. Cardiopericardial silhouette is at upper limits of normal for size. No evidence for pneumomediastinum. The visualized bony structures of the thorax are intact. Telemetry leads overlie the chest. IMPRESSION: Emphysema without acute cardiopulmonary findings. No Evidence for radiopaque foreign body over the mediastinum. No evidence for pneumomediastinum. Electronically Signed   By: Kennith Center M.D.   On: 12/30/2015 18:25    Procedures Procedures (including critical care time)  Medications Ordered in ED Medications  nitroGLYCERIN (NITROSTAT) SL tablet 0.4 mg (not administered)  gi cocktail (Maalox,Lidocaine,Donnatal) (30 mLs Oral Given 12/30/15 1801)  aspirin tablet 325 mg (325 mg Oral Given 12/30/15 2030)     Initial Impression / Assessment and Plan / ED Course  I have reviewed the triage vital signs and the nursing notes.  Pertinent labs & imaging results that were available during my care of the patient were reviewed by me and considered in my medical decision making (see chart for details).  Clinical Course    Likely GI in origin but not improved with GI cocktail and has detectable troponin so will admit for ACS  rule out. ECG ok. ASA given. NTG given.   Final Clinical Impressions(s) / ED Diagnoses   Final diagnoses:  Chest pain, unspecified type   New Prescriptions New Prescriptions   No medications on file     Marily Memos, MD 12/30/15 2114

## 2015-12-30 NOTE — ED Notes (Signed)
CRITICAL VALUE ALERT  Critical value received:  Troponin 0.03  Date of notification:  12-30-2015  Time of notification:  1959  Critical value read back:YES  Nurse who received alert:  Sharia Reeve, RN  MD notified (1st page):  Dr. Clayborne Dana at [phone]

## 2015-12-30 NOTE — H&P (Signed)
History and Physical  Leah Crane RJJ:[id number] DOB: 05-07-1926 DOA: 12/30/2015  Referring physician: Dr Clayborne Dana, ED physician PCP: Olen Cordial, MD  Outpatient Specialists:    Chief Complaint: Chest pain  HPI: Leah Crane is a 82 y.o. female with a history of GERD, hypertension, arthritis, restless leg syndrome, edema. Patient seen for chest pain that started last night. Pain feels much like her normal GERD symptoms with substernal discomfort however this is much worse. This discomfort has been constant and has not improved despite taking her Prilosec and Zantac. She also took Tums which did not help. The pain is described as a burning pain with severe pressure - "like I have food caught in my throat". No radiation of pain. No diaphoresis.  Emergency Department Course: Patient received GI cocktail which did not help. Troponin slightly elevated at 0.03. Pain has not improved.  Review of Systems:   Pt denies any fevers, chills, nausea, vomiting, diarrhea, constipation, abdominal pain, shortness of breath, dyspnea on exertion, orthopnea, cough, wheezing, palpitations, headache, vision changes, lightheadedness, dizziness, melena, rectal bleeding.  Review of systems are otherwise negative  Past Medical History:  Diagnosis Date  . Arthritis   . Chronic constipation   . GERD (gastroesophageal reflux disease)   . History of hiatal hernia   . Hypertension   . Hypokalemia   . Multiple thyroid nodules   . Peripheral edema   . Restless leg    Past Surgical History:  Procedure Laterality Date  . ABDOMINAL HYSTERECTOMY    . HERNIA REPAIR    . HIATAL HERNIA REPAIR    . REPLACEMENT TOTAL KNEE     Social History:  reports that she has never smoked. She has never used smokeless tobacco. She reports that she does not drink alcohol or use drugs. Patient lives at Home  No Known Allergies  Family History  Problem Relation Age of Onset  . Cancer - Lung Father      Prior to Admission  medications   Medication Sig Start Date End Date Taking? Authorizing Provider  aspirin EC 81 MG tablet Take 81 mg by mouth 4 (four) times a week. MWF, and Sundays   Yes Historical Provider, MD  Calcium Carb-Cholecalciferol (CALCIUM 600+D) 600-800 MG-UNIT TABS Take 1 tablet by mouth 2 (two) times daily.   Yes Historical Provider, MD  docusate sodium (COLACE) 100 MG capsule Take 1 capsule (100 mg total) by mouth 2 (two) times daily. Patient taking differently: Take 100-200 mg by mouth 2 (two) times daily. 2 in the morning and 1 in the evening at 6pm 01/17/15  Yes Hollice Espy, MD  felodipine (PLENDIL) 10 MG 24 hr tablet Take 10 mg by mouth every morning.  01/05/15  Yes Historical Provider, MD  furosemide (LASIX) 40 MG tablet Take 80 mg by mouth 3 (three) times a week. Tuesdays, Thursdays, and Saturdays, depends on fluid   Yes Historical Provider, MD  hydrochlorothiazide (HYDRODIURIL) 25 MG tablet Take 25 mg by mouth every morning.   Yes Historical Provider, MD  HYDROcodone-acetaminophen (NORCO) 10-325 MG tablet Take 1 tablet by mouth 4 (four) times daily as needed for moderate pain or severe pain.    Yes Historical Provider, MD  hydroxychloroquine (PLAQUENIL) 200 MG tablet Take 200 mg by mouth every morning.    Yes Historical Provider, MD  metolazone (ZAROXOLYN) 2.5 MG tablet Take 2.5 mg by mouth every Monday, Wednesday, and Friday.   Yes Historical Provider, MD  omeprazole (PRILOSEC) 20 MG capsule Take 20 mg by mouth  every morning.   Yes Historical Provider, MD  piroxicam (FELDENE) 10 MG capsule Take 10 mg by mouth every morning.   Yes Historical Provider, MD  potassium chloride SA (K-DUR,KLOR-CON) 20 MEQ tablet Take 20 mEq by mouth every morning.    Yes Historical Provider, MD  pramipexole (MIRAPEX) 0.125 MG tablet Take 0.125 mg by mouth daily at 6 PM.    Yes Historical Provider, MD  predniSONE (DELTASONE) 5 MG tablet Take 5 mg by mouth every morning.   Yes Historical Provider, MD  quinapril  (ACCUPRIL) 40 MG tablet Take 40 mg by mouth 2 (two) times daily.   Yes Historical Provider, MD  ranitidine (ZANTAC) 150 MG tablet Take 150 mg by mouth daily at 6 PM.   Yes Historical Provider, MD  tiZANidine (ZANAFLEX) 4 MG tablet Take 4 mg by mouth 2 (two) times daily. Morning and at bedtime. *prescribed three times daily   Yes Historical Provider, MD  vitamin B-12 (CYANOCOBALAMIN) 1000 MCG tablet Take 1,000 mcg by mouth every morning.   Yes Historical Provider, MD    Physical Exam: BP (!) 164/57 (BP Location: Right Arm)   Pulse 62   Temp 97.7 F (36.5 C) (Oral)   Resp 20   Wt 85.4 kg (188 lb 4.8 oz)   SpO2 96%   BMI 29.49 kg/m   General: Elderly Caucasian female. Awake and alert and oriented x3. No acute cardiopulmonary distress.  HEENT: Normocephalic atraumatic.  Right and left ears normal in appearance.  Pupils equal, round, reactive to light. Extraocular muscles are intact. Sclerae anicteric and noninjected.  Moist mucosal membranes. No mucosal lesions.  Neck: Neck supple without lymphadenopathy. No carotid bruits. No masses palpated.  Cardiovascular: Regular rate with normal S1-S2 sounds. No murmurs, rubs, gallops auscultated. No JVD.  Respiratory: Good respiratory effort with no wheezes, rales, rhonchi. Lungs clear to auscultation bilaterally.  No accessory muscle use. Abdomen: Soft, nontender, nondistended. Active bowel sounds. No masses or hepatosplenomegaly  Skin: No rashes, lesions, or ulcerations.  Dry, warm to touch. 2+ dorsalis pedis and radial pulses. Musculoskeletal: No calf or leg pain. All major joints not erythematous nontender.  No upper or lower joint deformation.  Good ROM.  No contractures. Tenderness to lower chest wall with palpation  Psychiatric: Intact judgment and insight. Pleasant and cooperative. Neurologic: No focal neurological deficits. Strength is 5/5 and symmetric in upper and lower extremities.  Cranial nerves II through XII are grossly intact.            Labs on Admission: I have personally reviewed following labs and imaging studies  CBC:  Recent Labs Lab 12/30/15 1832  WBC 5.9  NEUTROABS 4.1  HGB 11.1*  HCT 33.7*  MCV 94.7  PLT 257   Basic Metabolic Panel:  Recent Labs Lab 12/30/15 1832  NA 136  K 3.7  CL 101  CO2 28  GLUCOSE 89  BUN 14  CREATININE 1.01*  CALCIUM 9.4   GFR: CrCl cannot be calculated (Unknown ideal weight.). Liver Function Tests:  Recent Labs Lab 12/30/15 1832  AST 21  ALT 13*  ALKPHOS 65  BILITOT 0.5  PROT 6.6  ALBUMIN 3.7   No results for input(s): LIPASE, AMYLASE in the last 168 hours. No results for input(s): AMMONIA in the last 168 hours. Coagulation Profile: No results for input(s): INR, PROTIME in the last 168 hours. Cardiac Enzymes:  Recent Labs Lab 12/30/15 1832  TROPONINI 0.03*   BNP (last 3 results) No results for input(s): PROBNP in the  last 8760 hours. HbA1C: No results for input(s): HGBA1C in the last 72 hours. CBG: No results for input(s): GLUCAP in the last 168 hours. Lipid Profile: No results for input(s): CHOL, HDL, LDLCALC, TRIG, CHOLHDL, LDLDIRECT in the last 72 hours. Thyroid Function Tests: No results for input(s): TSH, T4TOTAL, FREET4, T3FREE, THYROIDAB in the last 72 hours. Anemia Panel: No results for input(s): VITAMINB12, FOLATE, FERRITIN, TIBC, IRON, RETICCTPCT in the last 72 hours. Urine analysis:    Component Value Date/Time   COLORURINE YELLOW 12/30/2015 2000   APPEARANCEUR CLEAR 12/30/2015 2000   LABSPEC 1.010 12/30/2015 2000   PHURINE 6.0 12/30/2015 2000   GLUCOSEU NEGATIVE 12/30/2015 2000   HGBUR NEGATIVE 12/30/2015 2000   BILIRUBINUR NEGATIVE 12/30/2015 2000   KETONESUR NEGATIVE 12/30/2015 2000   PROTEINUR NEGATIVE 12/30/2015 2000   NITRITE NEGATIVE 12/30/2015 2000   LEUKOCYTESUR NEGATIVE 12/30/2015 2000   Sepsis Labs: @LABRCNTIP (procalcitonin:4,lacticidven:4) )No results found for this or any previous visit (from the past 240  hour(s)).   Radiological Exams on Admission: Dg Chest 2 View  Result Date: 12/30/2015 CLINICAL DATA:  Patient feels like something is stuck in the midesophagus. EXAM: CHEST  2 VIEW COMPARISON:  None. FINDINGS: Lungs are hyperexpanded. The lungs are clear wiithout focal pneumonia, edema, pneumothorax or pleural effusion. Interstitial markings are diffusely coarsened with chronic features. Cardiopericardial silhouette is at upper limits of normal for size. No evidence for pneumomediastinum. The visualized bony structures of the thorax are intact. Telemetry leads overlie the chest. IMPRESSION: Emphysema without acute cardiopulmonary findings. No Evidence for radiopaque foreign body over the mediastinum. No evidence for pneumomediastinum. Electronically Signed   By: 13/12/2015 M.D.   On: 12/30/2015 18:25    EKG: Independently reviewed. Normal sinus rhythm  Assessment/Plan: Principal Problem:   Chest pain Active Problems:   Hypertension   GERD (gastroesophageal reflux disease)   History of hiatal hernia    This patient was discussed with the ED physician, including pertinent vitals, physical exam findings, labs, and imaging.  We also discussed care given by the ED provider.  #1 chest pain  Observation overnight  This is doubtful ACS more likely due to patient's hiatal hernia, due to the marginally elevated troponin, will keep patient overnight for rule out  Serial troponins  Telemetry monitoring  Morphine for pain  Nitroglycerin #2 GERD  Protonix and Pepcid #3 history of hiatal hernia  If continues to be symptomatic, possibly consult GI #4 hypertension  Continue antihypertensives  DVT prophylaxis: Lovenox Consultants: None Code Status: Full code Family Communication: Daughter in room  Disposition Plan: Home after observation   13/12/2015, DO [facility] Pager [phone]  If 7PM-7AM, please contact night-coverage [email or web address] Password [email or web address]

## 2015-12-30 NOTE — ED Notes (Signed)
Patient transported to X-ray 

## 2015-12-30 NOTE — ED Triage Notes (Signed)
Pt states that she is been having heartburn since yesterday and it has worsened today.  She states that she is having trouble swallowing and it feels like something is hung in her throat.

## 2015-12-31 DIAGNOSIS — K219 Gastro-esophageal reflux disease without esophagitis: Secondary | ICD-10-CM | POA: Diagnosis not present

## 2015-12-31 DIAGNOSIS — R079 Chest pain, unspecified: Secondary | ICD-10-CM | POA: Diagnosis not present

## 2015-12-31 LAB — TROPONIN I
Troponin I: 0.03 ng/mL (ref <0.03)
Troponin I: 0.03 ng/mL (ref <0.03)
Troponin I: 0.03 ng/mL (ref <0.03)

## 2015-12-31 MED ORDER — LISINOPRIL 10 MG PO TABS
40.0000 mg | ORAL_TABLET | Freq: Two times a day (BID) | ORAL | Status: DC
  Administered 2015-12-31: 40 mg via ORAL
  Filled 2015-12-31: qty 4

## 2015-12-31 NOTE — Progress Notes (Signed)
Patient discharged home with daughter, with personal belongings, and IV removed and site intact.

## 2015-12-31 NOTE — Progress Notes (Signed)
CRITICAL VALUE ALERT  Critical value received:  Troponin 0.03  Date of notification:  12/31/2015  Time of notification:  1240  Critical value read back:Yes.    Nurse who received alert:  Vivi Ferns, RN   Troponin value elevated at 0.03. Unchanged from previous value.  Vivi Ferns, BSN, RN 12/31/2015 12:44 AM

## 2015-12-31 NOTE — Discharge Summary (Signed)
Physician Discharge Summary  Leah Crane DJS:[id number] DOB: 1926-08-15 DOA: 12/30/2015  PCP: Olen Cordial, MD  Admit date: 12/30/2015 Discharge date: 12/31/2015  Time spent: > 35 minutes  Recommendations for Outpatient Follow-up:  1. Further evaluate for suspected GI related chest discomfort   Discharge Diagnoses:  Principal Problem:   Chest pain Active Problems:   Hypertension   GERD (gastroesophageal reflux disease)   History of hiatal hernia   Discharge Condition: stable  Diet recommendation: heart healthy  Filed Weights   12/30/15 2129  Weight: 85.4 kg (188 lb 4.8 oz)    History of present illness:  82 y.o. female with a history of GERD, hypertension, arthritis, restless leg syndrome, edema. Patient seen for chest pain that started last night.  Hospital Course:  Chest pain - work up negative, EKG unchanged when compared to last. - chest discomfort was atypical and not worse with activity - Patient felt like she had food stuck. No chest pain reported on day of discharge - recommend patient f/u with pcp for further evaluation and recommendations.  Procedures:  None  Consultations:  None  Discharge Exam: Vitals:   12/31/15 0529 12/31/15 0858  BP: (!) 135/48 (!) 150/54  Pulse: (!) 53   Resp: 20   Temp: 97.6 F (36.4 C)     General: pt in nad, alert and awake Cardiovascular: rrr, no rubs Respiratory: no increased wob, no wheezes  Discharge Instructions   Discharge Instructions    Call MD for:  persistant nausea and vomiting    Complete by:  As directed    Call MD for:  temperature >100.4    Complete by:  As directed    Diet - low sodium heart healthy    Complete by:  As directed    Discharge instructions    Complete by:  As directed    Please be sure to follow up with your primary care physician for further evaluation and recommendations.   Increase activity slowly    Complete by:  As directed      Current Discharge Medication List     CONTINUE these medications which have NOT CHANGED   Details  aspirin EC 81 MG tablet Take 81 mg by mouth 4 (four) times a week. MWF, and Sundays    Calcium Carb-Cholecalciferol (CALCIUM 600+D) 600-800 MG-UNIT TABS Take 1 tablet by mouth 2 (two) times daily.    docusate sodium (COLACE) 100 MG capsule Take 1 capsule (100 mg total) by mouth 2 (two) times daily. Qty: 60 capsule, Refills: 0    felodipine (PLENDIL) 10 MG 24 hr tablet Take 10 mg by mouth every morning.  Refills: 4    furosemide (LASIX) 40 MG tablet Take 80 mg by mouth 3 (three) times a week. Tuesdays, Thursdays, and Saturdays, depends on fluid    hydrochlorothiazide (HYDRODIURIL) 25 MG tablet Take 25 mg by mouth every morning.    HYDROcodone-acetaminophen (NORCO) 10-325 MG tablet Take 1 tablet by mouth 4 (four) times daily as needed for moderate pain or severe pain.     hydroxychloroquine (PLAQUENIL) 200 MG tablet Take 200 mg by mouth every morning.     metolazone (ZAROXOLYN) 2.5 MG tablet Take 2.5 mg by mouth every Monday, Wednesday, and Friday.    omeprazole (PRILOSEC) 20 MG capsule Take 20 mg by mouth every morning.    piroxicam (FELDENE) 10 MG capsule Take 10 mg by mouth every morning.    potassium chloride SA (K-DUR,KLOR-CON) 20 MEQ tablet Take 20 mEq by mouth every morning.  pramipexole (MIRAPEX) 0.125 MG tablet Take 0.125 mg by mouth daily at 6 PM.     predniSONE (DELTASONE) 5 MG tablet Take 5 mg by mouth every morning.    quinapril (ACCUPRIL) 40 MG tablet Take 40 mg by mouth 2 (two) times daily.    ranitidine (ZANTAC) 150 MG tablet Take 150 mg by mouth daily at 6 PM.    tiZANidine (ZANAFLEX) 4 MG tablet Take 4 mg by mouth 2 (two) times daily. Morning and at bedtime. *prescribed three times daily    vitamin B-12 (CYANOCOBALAMIN) 1000 MCG tablet Take 1,000 mcg by mouth every morning.       No Known Allergies    The results of significant diagnostics from this hospitalization (including imaging,  microbiology, ancillary and laboratory) are listed below for reference.    Significant Diagnostic Studies: Dg Chest 2 View  Result Date: 12/30/2015 CLINICAL DATA:  Patient feels like something is stuck in the midesophagus. EXAM: CHEST  2 VIEW COMPARISON:  None. FINDINGS: Lungs are hyperexpanded. The lungs are clear wiithout focal pneumonia, edema, pneumothorax or pleural effusion. Interstitial markings are diffusely coarsened with chronic features. Cardiopericardial silhouette is at upper limits of normal for size. No evidence for pneumomediastinum. The visualized bony structures of the thorax are intact. Telemetry leads overlie the chest. IMPRESSION: Emphysema without acute cardiopulmonary findings. No Evidence for radiopaque foreign body over the mediastinum. No evidence for pneumomediastinum. Electronically Signed   By: Kennith Center M.D.   On: 12/30/2015 18:25    Microbiology: No results found for this or any previous visit (from the past 240 hour(s)).   Labs: Basic Metabolic Panel:  Recent Labs Lab 12/30/15 1832  NA 136  K 3.7  CL 101  CO2 28  GLUCOSE 89  BUN 14  CREATININE 1.01*  CALCIUM 9.4   Liver Function Tests:  Recent Labs Lab 12/30/15 1832  AST 21  ALT 13*  ALKPHOS 65  BILITOT 0.5  PROT 6.6  ALBUMIN 3.7   No results for input(s): LIPASE, AMYLASE in the last 168 hours. No results for input(s): AMMONIA in the last 168 hours. CBC:  Recent Labs Lab 12/30/15 1832  WBC 5.9  NEUTROABS 4.1  HGB 11.1*  HCT 33.7*  MCV 94.7  PLT 257   Cardiac Enzymes:  Recent Labs Lab 12/30/15 1832 12/30/15 2341 12/31/15 0404 12/31/15 0855  TROPONINI 0.03* 0.03* 0.03* 0.03*   BNP: BNP (last 3 results) No results for input(s): BNP in the last 8760 hours.  ProBNP (last 3 results) No results for input(s): PROBNP in the last 8760 hours.  CBG: No results for input(s): GLUCAP in the last 168 hours.     Signed:  Penny Pia MD.  [facility] 12/31/2015, 12:14 PM

## 2015-12-31 NOTE — Care Management Obs Status (Signed)
MEDICARE OBSERVATION STATUS NOTIFICATION   Patient Details  Name: Leah Crane MRN: [id number] Date of Birth: 12-06-1926   Medicare Observation Status Notification Given:  No    Fuller Plan, RN 12/31/2015, 12:23 PM

## 2016-01-02 LAB — URINE CULTURE

## 2017-05-08 ENCOUNTER — Emergency Department (HOSPITAL_COMMUNITY): Payer: Medicare Other

## 2017-05-08 ENCOUNTER — Encounter (HOSPITAL_COMMUNITY): Payer: Self-pay | Admitting: Emergency Medicine

## 2017-05-08 ENCOUNTER — Other Ambulatory Visit: Payer: Self-pay

## 2017-05-08 ENCOUNTER — Emergency Department (HOSPITAL_COMMUNITY)
Admission: EM | Admit: 2017-05-08 | Discharge: 2017-05-08 | Disposition: A | Discharge status: Home / Self Care | Payer: Medicare Other | Attending: Emergency Medicine | Admitting: Emergency Medicine

## 2017-05-08 DIAGNOSIS — Z79899 Other long term (current) drug therapy: Secondary | ICD-10-CM | POA: Diagnosis not present

## 2017-05-08 DIAGNOSIS — I10 Essential (primary) hypertension: Secondary | ICD-10-CM | POA: Diagnosis not present

## 2017-05-08 DIAGNOSIS — K5900 Constipation, unspecified: Secondary | ICD-10-CM | POA: Diagnosis not present

## 2017-05-08 DIAGNOSIS — Z7982 Long term (current) use of aspirin: Secondary | ICD-10-CM | POA: Insufficient documentation

## 2017-05-08 LAB — COMPREHENSIVE METABOLIC PANEL
ALT: 17 U/L (ref 14–54)
ANION GAP: 11 (ref 5–15)
AST: 24 U/L (ref 15–41)
Albumin: 3.9 g/dL (ref 3.5–5.0)
Alkaline Phosphatase: 68 U/L (ref 38–126)
BUN: 17 mg/dL (ref 6–20)
CALCIUM: 9.7 mg/dL (ref 8.9–10.3)
CHLORIDE: 96 mmol/L — AB (ref 101–111)
CO2: 26 mmol/L (ref 22–32)
CREATININE: 1.11 mg/dL — AB (ref 0.44–1.00)
GFR calc non Af Amer: 43 mL/min — ABNORMAL LOW (ref >60)
GFR, EST AFRICAN AMERICAN: 50 mL/min — AB (ref >60)
Glucose, Bld: 102 mg/dL — ABNORMAL HIGH (ref 65–99)
Potassium: 4.2 mmol/L (ref 3.5–5.1)
Sodium: 133 mmol/L — ABNORMAL LOW (ref 135–145)
Total Bilirubin: 0.8 mg/dL (ref 0.3–1.2)
Total Protein: 7 g/dL (ref 6.5–8.1)

## 2017-05-08 LAB — CBC
HCT: 34.5 % — ABNORMAL LOW (ref 36.0–46.0)
Hemoglobin: 11.5 g/dL — ABNORMAL LOW (ref 12.0–15.0)
MCH: 31.6 pg (ref 26.0–34.0)
MCHC: 33.3 g/dL (ref 30.0–36.0)
MCV: 94.8 fL (ref 78.0–100.0)
PLATELETS: 262 10*3/uL (ref 150–400)
RBC: 3.64 MIL/uL — AB (ref 3.87–5.11)
RDW: 12.5 % (ref 11.5–15.5)
WBC: 5.1 10*3/uL (ref 4.0–10.5)

## 2017-05-08 LAB — TYPE AND SCREEN
ABO/RH(D): O POS
Antibody Screen: NEGATIVE

## 2017-05-08 LAB — POC OCCULT BLOOD, ED: FECAL OCCULT BLD: NEGATIVE

## 2017-05-08 NOTE — ED Provider Notes (Addendum)
[facility] EMERGENCY DEPARTMENT Provider Note   CSN: [id number] Arrival date & time: 05/08/17  1616     History   Chief Complaint Chief Complaint  Patient presents with  . Constipation    HPI Leah Crane is a 84 y.o. female.  Patient complains of having constipation.  She was put on Metamucil and that helps some.  Patient states her stools seem to be more stringy lately.  The history is provided by the patient.  Constipation   This is a recurrent problem. The current episode started more than 2 days ago. The stool is described as firm. Pertinent negatives include no abdominal pain. There is fiber in the patient's diet. Treatments tried: Metamucil.    Past Medical History:  Diagnosis Date  . Arthritis   . Chronic constipation   . GERD (gastroesophageal reflux disease)   . History of hiatal hernia   . Hypertension   . Hypokalemia   . Multiple thyroid nodules   . Peripheral edema   . Restless leg     Patient Active Problem List   Diagnosis Date Noted  . Chest pain 12/30/2015  . GERD (gastroesophageal reflux disease)   . History of hiatal hernia   . AKI (acute kidney injury) (HCC) 01/16/2015  . Colitis 01/16/2015  . Urinary tract infectious disease 01/16/2015  . Hypertension 01/16/2015  . Syncope 01/15/2015    Past Surgical History:  Procedure Laterality Date  . ABDOMINAL HYSTERECTOMY    . HERNIA REPAIR    . HIATAL HERNIA REPAIR    . REPLACEMENT TOTAL KNEE      OB History   None      Home Medications    Prior to Admission medications   Medication Sig Start Date End Date Taking? Authorizing Provider  aspirin EC 81 MG tablet Take 81 mg by mouth 4 (four) times a week. MWF, and Sundays   Yes [provider]  Calcium Carb-Cholecalciferol (CALCIUM 600+D) 600-800 MG-UNIT TABS Take 1 tablet by mouth 2 (two) times daily.   Yes [provider]  felodipine (PLENDIL) 10 MG 24 hr tablet Take 10 mg by mouth every morning.  01/05/15  Yes  [provider]  furosemide (LASIX) 40 MG tablet Take 80 mg by mouth 3 (three) times a week. Tuesdays, and Saturdays, depends on fluid   Yes [provider]  hydrochlorothiazide (HYDRODIURIL) 25 MG tablet Take 25 mg by mouth every morning.   Yes [provider]  HYDROcodone-acetaminophen (NORCO) 10-325 MG tablet Take 1 tablet by mouth 4 (four) times daily as needed for moderate pain or severe pain.    Yes [provider]  hydroxychloroquine (PLAQUENIL) 200 MG tablet Take 200 mg by mouth every morning.    Yes [provider]  metolazone (ZAROXOLYN) 2.5 MG tablet Take 2.5 mg by mouth every Monday, Wednesday, and Friday.   Yes [provider]  nystatin-triamcinolone (MYCOLOG II) cream APPLY TO AFFECTED AREA TWICE A DAY 03/26/17  Yes [provider]  omeprazole (PRILOSEC) 20 MG capsule Take 20 mg by mouth every morning.   Yes [provider]  piroxicam (FELDENE) 10 MG capsule Take 10 mg by mouth every morning.   Yes [provider]  potassium chloride SA (K-DUR,KLOR-CON) 20 MEQ tablet Take 20 mEq by mouth every morning.    Yes [provider]  pramipexole (MIRAPEX) 0.25 MG tablet TAKE ONE TABLET BY MOUTH BEFORE BEDTIME 03/21/17  Yes [provider]  predniSONE (DELTASONE) 5 MG tablet Take 5 mg  by mouth every morning.   Yes [provider]  quinapril (ACCUPRIL) 40 MG tablet Take 40 mg by mouth 2 (two) times daily.   Yes [provider]  ranitidine (ZANTAC) 150 MG tablet Take 150 mg by mouth daily at 6 PM.   Yes [provider]  senna (SENOKOT) 8.6 MG TABS tablet Take 1 tablet by mouth 2 (two) times daily.   Yes [provider]  tiZANidine (ZANAFLEX) 4 MG tablet Take 4 mg by mouth 2 (two) times daily. Morning and at bedtime. *prescribed three times daily   Yes [provider]  vitamin B-12 (CYANOCOBALAMIN) 1000 MCG tablet Take 1,000 mcg by mouth every morning.   Yes  [provider]    Family History Family History  Problem Relation Age of Onset  . Cancer - Lung Father     Social History Social History   Tobacco Use  . Smoking status: Never Smoker  . Smokeless tobacco: Never Used  Substance Use Topics  . Alcohol use: No  . Drug use: No     Allergies   Patient has no known allergies.   Review of Systems Review of Systems  Constitutional: Negative for appetite change and fatigue.  HENT: Negative for congestion, ear discharge and sinus pressure.   Eyes: Negative for discharge.  Respiratory: Negative for cough.   Cardiovascular: Negative for chest pain.  Gastrointestinal: Positive for constipation. Negative for abdominal pain and diarrhea.  Genitourinary: Negative for frequency and hematuria.  Musculoskeletal: Negative for back pain.  Skin: Negative for rash.  Neurological: Negative for seizures and headaches.  Psychiatric/Behavioral: Negative for hallucinations.     Physical Exam Updated Vital Signs BP (!) 111/57   Pulse 97   Temp 98.2 F (36.8 C) (Oral)   Resp 18   Ht 5\' 6"  (1.676 m)   Wt 87.5 kg (193 lb)   SpO2 97%   BMI 31.15 kg/m   Physical Exam  Constitutional: She is oriented to person, place, and time. She appears well-developed.  HENT:  Head: Normocephalic.  Eyes: Conjunctivae and EOM are normal. No scleral icterus.  Neck: Neck supple. No tracheal deviation present. No thyromegaly present.  Cardiovascular: Normal rate and regular rhythm. Exam reveals no gallop and no friction rub.  No murmur heard. Pulmonary/Chest: No stridor. She has no wheezes. She has no rales. She exhibits no tenderness.  Abdominal: She exhibits no distension. There is no tenderness. There is no rebound.  Musculoskeletal: Normal range of motion. She exhibits no edema.  Lymphadenopathy:    She has no cervical adenopathy.  Neurological: She is oriented to person, place, and time. She exhibits normal muscle tone. Coordination  normal.  Skin: Skin is warm. No rash noted. No erythema.  Psychiatric: She has a normal mood and affect. Her behavior is normal.  Nursing note and vitals reviewed.    ED Treatments / Results  Labs (all labs ordered are listed, but only abnormal results are displayed) Labs Reviewed  COMPREHENSIVE METABOLIC PANEL - Abnormal; Notable for the following components:      Result Value   Sodium 133 (*)    Chloride 96 (*)    Glucose, Bld 102 (*)    Creatinine, Ser 1.11 (*)    GFR calc non Af Amer 43 (*)    GFR calc Af Amer 50 (*)    All other components within normal limits  CBC - Abnormal; Notable for the following components:   RBC 3.64 (*)    Hemoglobin 11.5 (*)  HCT 34.5 (*)    All other components within normal limits  OCCULT BLOOD X 1 CARD TO LAB, STOOL  POC OCCULT BLOOD, ED  TYPE AND SCREEN    EKG  EKG Interpretation None       Radiology Ct Renal Stone Study  Result Date: 05/08/2017 CLINICAL DATA:  Abnormal bowel movements with chronic back pain EXAM: CT ABDOMEN AND PELVIS WITHOUT CONTRAST TECHNIQUE: Multidetector CT imaging of the abdomen and pelvis was performed following the standard protocol without IV contrast. COMPARISON:  CT 01/14/2015 FINDINGS: Lower chest: Lung bases demonstrate no acute consolidation or pleural effusion. The heart size is within normal limits. Coronary vascular calcification. Hepatobiliary: Stable cysts within the liver. Scattered granuloma. No calcified gallstones or biliary dilatation. Pancreas: Unremarkable. No pancreatic ductal dilatation or surrounding inflammatory changes. Spleen: Normal in size without focal abnormality. Adrenals/Urinary Tract: Adrenal glands are stable; 2.1 cm left adrenal gland mass unchanged, likely an adenoma. Hypodense lesions within the kidneys, possible cysts but incompletely characterized without contrast. No hydronephrosis. No ureteral stone. The bladder is unremarkable Stomach/Bowel: The stomach is nonenlarged. No  dilated small bowel. The appendix is not well seen but no right lower quadrant inflammation is identified. Possible mild focal wall thickening involving the distal transverse colon and distal descending/sigmoid colon. Vascular/Lymphatic: Moderate aortic atherosclerosis. No aneurysmal dilatation. No significantly enlarged lymph nodes. Reproductive: Status post hysterectomy. No adnexal masses. Other: Negative for free air or free fluid. Musculoskeletal: Diffuse degenerative changes. New mild superior endplate deformities at L3 and L4 since 2016. Trace anterolisthesis of L4 on L5. IMPRESSION: 1. Possible mild colon wall thickening/colitis type changes involving the distal transverse colon and distal descending/sigmoid colon. Negative for a bowel obstruction. 2. Stable 2.1 cm left adrenal mass, likely adenoma 3. Stable liver cysts Electronically Signed   By: Jasmine Pang M.D.   On: 05/08/2017 19:20    Procedures Procedures (including critical care time)  Medications Ordered in ED Medications - No data to display   Initial Impression / Assessment and Plan / ED Course  I have reviewed the triage vital signs and the nursing notes.  Pertinent labs & imaging results that were available during my care of the patient were reviewed by me and considered in my medical decision making (see chart for details).   CBCs and chemistry unremarkable.  Patient has mild anemia.  Hemoccult negative.  CT scan shows possible mild colon thickening.  Patient will continue Metamucil and is referred to GI    Final Clinical Impressions(s) / ED Diagnoses   Final diagnoses:  Constipation, unspecified constipation type    ED Discharge Orders    None       Bethann Berkshire, MD 05/08/17 Nolen Mu    Bethann Berkshire, MD 05/08/17 [phone]

## 2017-05-08 NOTE — ED Notes (Signed)
PT FAMILY BROUGHT IN SOME STOOL IT WAS VERY STRINGY AND PASTE

## 2017-05-08 NOTE — ED Triage Notes (Signed)
Pt family reports history of same for last several 3-4 weeks. Pt family reports pt has been taking stool softener and metamucil at home after being told had "stool in intestines." pt family reports BM today was black stool and pt reports intermittent back pain. Pt reports emesis x1.

## 2017-05-08 NOTE — Discharge Instructions (Signed)
Continue with the Metamucil and follow-up with your family doctor or Dr. Dorris Carnes in a couple weeks

## 2018-02-26 ENCOUNTER — Inpatient Hospital Stay (HOSPITAL_COMMUNITY)
Admission: AD | Admit: 2018-02-26 | Discharge: 2018-02-27 | DRG: 445 | Disposition: A | Discharge status: Home / Self Care | Payer: Medicare Other | Source: Other Acute Inpatient Hospital | Attending: Internal Medicine | Admitting: Internal Medicine

## 2018-02-26 ENCOUNTER — Encounter (HOSPITAL_COMMUNITY): Payer: Self-pay | Admitting: Internal Medicine

## 2018-02-26 ENCOUNTER — Inpatient Hospital Stay (HOSPITAL_COMMUNITY): Payer: Medicare Other

## 2018-02-26 ENCOUNTER — Other Ambulatory Visit: Payer: Self-pay

## 2018-02-26 DIAGNOSIS — K828 Other specified diseases of gallbladder: Secondary | ICD-10-CM | POA: Diagnosis not present

## 2018-02-26 DIAGNOSIS — I340 Nonrheumatic mitral (valve) insufficiency: Secondary | ICD-10-CM | POA: Diagnosis not present

## 2018-02-26 DIAGNOSIS — D638 Anemia in other chronic diseases classified elsewhere: Secondary | ICD-10-CM | POA: Diagnosis not present

## 2018-02-26 DIAGNOSIS — N183 Chronic kidney disease, stage 3 (moderate): Secondary | ICD-10-CM | POA: Diagnosis present

## 2018-02-26 DIAGNOSIS — I370 Nonrheumatic pulmonary valve stenosis: Secondary | ICD-10-CM | POA: Diagnosis not present

## 2018-02-26 DIAGNOSIS — E876 Hypokalemia: Secondary | ICD-10-CM | POA: Diagnosis not present

## 2018-02-26 DIAGNOSIS — K8043 Calculus of bile duct with acute cholecystitis with obstruction: Secondary | ICD-10-CM | POA: Diagnosis not present

## 2018-02-26 DIAGNOSIS — N179 Acute kidney failure, unspecified: Secondary | ICD-10-CM | POA: Diagnosis present

## 2018-02-26 DIAGNOSIS — G2581 Restless legs syndrome: Secondary | ICD-10-CM | POA: Diagnosis present

## 2018-02-26 DIAGNOSIS — I129 Hypertensive chronic kidney disease with stage 1 through stage 4 chronic kidney disease, or unspecified chronic kidney disease: Secondary | ICD-10-CM | POA: Diagnosis not present

## 2018-02-26 DIAGNOSIS — Z96659 Presence of unspecified artificial knee joint: Secondary | ICD-10-CM | POA: Diagnosis present

## 2018-02-26 DIAGNOSIS — Z79899 Other long term (current) drug therapy: Secondary | ICD-10-CM

## 2018-02-26 DIAGNOSIS — Z7952 Long term (current) use of systemic steroids: Secondary | ICD-10-CM

## 2018-02-26 DIAGNOSIS — I872 Venous insufficiency (chronic) (peripheral): Secondary | ICD-10-CM | POA: Diagnosis present

## 2018-02-26 DIAGNOSIS — G8929 Other chronic pain: Secondary | ICD-10-CM | POA: Diagnosis present

## 2018-02-26 DIAGNOSIS — M069 Rheumatoid arthritis, unspecified: Secondary | ICD-10-CM | POA: Diagnosis not present

## 2018-02-26 DIAGNOSIS — K5909 Other constipation: Secondary | ICD-10-CM | POA: Diagnosis present

## 2018-02-26 DIAGNOSIS — I10 Essential (primary) hypertension: Secondary | ICD-10-CM

## 2018-02-26 DIAGNOSIS — Z79891 Long term (current) use of opiate analgesic: Secondary | ICD-10-CM

## 2018-02-26 DIAGNOSIS — Z90710 Acquired absence of both cervix and uterus: Secondary | ICD-10-CM | POA: Diagnosis not present

## 2018-02-26 DIAGNOSIS — Z01818 Encounter for other preprocedural examination: Secondary | ICD-10-CM

## 2018-02-26 DIAGNOSIS — K219 Gastro-esophageal reflux disease without esophagitis: Secondary | ICD-10-CM | POA: Diagnosis present

## 2018-02-26 DIAGNOSIS — R7989 Other specified abnormal findings of blood chemistry: Secondary | ICD-10-CM

## 2018-02-26 DIAGNOSIS — I878 Other specified disorders of veins: Secondary | ICD-10-CM | POA: Diagnosis not present

## 2018-02-26 DIAGNOSIS — R945 Abnormal results of liver function studies: Secondary | ICD-10-CM

## 2018-02-26 DIAGNOSIS — R1011 Right upper quadrant pain: Secondary | ICD-10-CM

## 2018-02-26 DIAGNOSIS — Z801 Family history of malignant neoplasm of trachea, bronchus and lung: Secondary | ICD-10-CM

## 2018-02-26 DIAGNOSIS — I361 Nonrheumatic tricuspid (valve) insufficiency: Secondary | ICD-10-CM

## 2018-02-26 DIAGNOSIS — Z7982 Long term (current) use of aspirin: Secondary | ICD-10-CM

## 2018-02-26 DIAGNOSIS — F0390 Unspecified dementia without behavioral disturbance: Secondary | ICD-10-CM | POA: Diagnosis present

## 2018-02-26 DIAGNOSIS — E785 Hyperlipidemia, unspecified: Secondary | ICD-10-CM | POA: Diagnosis not present

## 2018-02-26 LAB — CBC WITH DIFFERENTIAL/PLATELET
ABS IMMATURE GRANULOCYTES: 0.02 10*3/uL (ref 0.00–0.07)
BASOS PCT: 0 %
Basophils Absolute: 0 10*3/uL (ref 0.0–0.1)
Eosinophils Absolute: 0.1 10*3/uL (ref 0.0–0.5)
Eosinophils Relative: 2 %
HCT: 25.9 % — ABNORMAL LOW (ref 36.0–46.0)
Hemoglobin: 8.6 g/dL — ABNORMAL LOW (ref 12.0–15.0)
IMMATURE GRANULOCYTES: 0 %
Lymphocytes Relative: 5 %
Lymphs Abs: 0.3 10*3/uL — ABNORMAL LOW (ref 0.7–4.0)
MCH: 31.7 pg (ref 26.0–34.0)
MCHC: 33.2 g/dL (ref 30.0–36.0)
MCV: 95.6 fL (ref 80.0–100.0)
MONOS PCT: 14 %
Monocytes Absolute: 0.9 10*3/uL (ref 0.1–1.0)
NEUTROS ABS: 4.9 10*3/uL (ref 1.7–7.7)
NEUTROS PCT: 79 %
Platelets: 156 10*3/uL (ref 150–400)
RBC: 2.71 MIL/uL — ABNORMAL LOW (ref 3.87–5.11)
RDW: 12.2 % (ref 11.5–15.5)
WBC: 6.2 10*3/uL (ref 4.0–10.5)
nRBC: 0 % (ref 0.0–0.2)

## 2018-02-26 LAB — ECHOCARDIOGRAM COMPLETE
Height: 67 in
WEIGHTICAEL: 3012.37 [oz_av]

## 2018-02-26 LAB — HEPATIC FUNCTION PANEL
ALBUMIN: 2.7 g/dL — AB (ref 3.5–5.0)
ALT: 106 U/L — ABNORMAL HIGH (ref 0–44)
AST: 51 U/L — AB (ref 15–41)
Alkaline Phosphatase: 65 U/L (ref 38–126)
BILIRUBIN INDIRECT: 0.7 mg/dL (ref 0.3–0.9)
Bilirubin, Direct: 0.2 mg/dL (ref 0.0–0.2)
TOTAL PROTEIN: 5.5 g/dL — AB (ref 6.5–8.1)
Total Bilirubin: 0.9 mg/dL (ref 0.3–1.2)

## 2018-02-26 LAB — BASIC METABOLIC PANEL
ANION GAP: 9 (ref 5–15)
BUN: 19 mg/dL (ref 8–23)
CALCIUM: 8.6 mg/dL — AB (ref 8.9–10.3)
CO2: 27 mmol/L (ref 22–32)
Chloride: 96 mmol/L — ABNORMAL LOW (ref 98–111)
Creatinine, Ser: 1.46 mg/dL — ABNORMAL HIGH (ref 0.44–1.00)
GFR calc non Af Amer: 32 mL/min — ABNORMAL LOW (ref >60)
GFR, EST AFRICAN AMERICAN: 37 mL/min — AB (ref >60)
GLUCOSE: 119 mg/dL — AB (ref 70–99)
POTASSIUM: 3.3 mmol/L — AB (ref 3.5–5.1)
Sodium: 132 mmol/L — ABNORMAL LOW (ref 135–145)

## 2018-02-26 LAB — TYPE AND SCREEN
ABO/RH(D): O POS
ANTIBODY SCREEN: NEGATIVE

## 2018-02-26 LAB — ABO/RH: ABO/RH(D): O POS

## 2018-02-26 LAB — PROTIME-INR
INR: 1.08
PROTHROMBIN TIME: 13.9 s (ref 11.4–15.2)

## 2018-02-26 MED ORDER — ONDANSETRON HCL 4 MG/2ML IJ SOLN: 4.0000 mg | Freq: Four times a day (QID) | INTRAMUSCULAR | Status: DC | PRN

## 2018-02-26 MED ORDER — ONDANSETRON HCL 4 MG PO TABS: 4.0000 mg | ORAL_TABLET | Freq: Four times a day (QID) | ORAL | Status: DC | PRN

## 2018-02-26 MED ORDER — PANTOPRAZOLE SODIUM 40 MG PO TBEC
40.0000 mg | DELAYED_RELEASE_TABLET | Freq: Every day | ORAL | Status: DC
  Administered 2018-02-26 – 2018-02-27 (×2): 40 mg via ORAL
  Filled 2018-02-26 (×2): qty 1

## 2018-02-26 MED ORDER — VITAMIN B-12 1000 MCG PO TABS
1000.0000 ug | ORAL_TABLET | Freq: Every morning | ORAL | Status: DC
  Administered 2018-02-26 – 2018-02-27 (×2): 1000 ug via ORAL
  Filled 2018-02-26 (×2): qty 1

## 2018-02-26 MED ORDER — FERROUS FUMARATE 324 (106 FE) MG PO TABS
1.0000 | ORAL_TABLET | ORAL | Status: DC
  Administered 2018-02-26: 106 mg via ORAL
  Filled 2018-02-26: qty 1

## 2018-02-26 MED ORDER — NICOTINE 14 MG/24HR TD PT24
14.0000 mg | MEDICATED_PATCH | Freq: Every day | TRANSDERMAL | Status: DC
  Administered 2018-02-26: 14 mg via TRANSDERMAL
  Filled 2018-02-26 (×2): qty 1

## 2018-02-26 MED ORDER — SODIUM CHLORIDE 0.9 % IV SOLN
INTRAVENOUS | Status: DC
  Administered 2018-02-26: 09:00:00 via INTRAVENOUS

## 2018-02-26 MED ORDER — FELODIPINE ER 10 MG PO TB24
10.0000 mg | ORAL_TABLET | Freq: Every morning | ORAL | Status: DC
  Administered 2018-02-26 – 2018-02-27 (×2): 10 mg via ORAL
  Filled 2018-02-26 (×2): qty 1

## 2018-02-26 MED ORDER — PIPERACILLIN-TAZOBACTAM 3.375 G IVPB
3.3750 g | Freq: Three times a day (TID) | INTRAVENOUS | Status: DC
  Administered 2018-02-26 – 2018-02-27 (×3): 3.375 g via INTRAVENOUS
  Filled 2018-02-26 (×5): qty 50

## 2018-02-26 MED ORDER — PREDNISONE 5 MG PO TABS
5.0000 mg | ORAL_TABLET | Freq: Every day | ORAL | Status: DC
  Administered 2018-02-27: 5 mg via ORAL
  Filled 2018-02-26: qty 1

## 2018-02-26 MED ORDER — TIZANIDINE HCL 4 MG PO TABS
4.0000 mg | ORAL_TABLET | Freq: Three times a day (TID) | ORAL | Status: DC
  Administered 2018-02-26 – 2018-02-27 (×3): 4 mg via ORAL
  Filled 2018-02-26 (×3): qty 1

## 2018-02-26 MED ORDER — HYDROXYCHLOROQUINE SULFATE 200 MG PO TABS
200.0000 mg | ORAL_TABLET | Freq: Every morning | ORAL | Status: DC
  Administered 2018-02-26 – 2018-02-27 (×2): 200 mg via ORAL
  Filled 2018-02-26 (×2): qty 1

## 2018-02-26 MED ORDER — HYDROCODONE-ACETAMINOPHEN 10-325 MG PO TABS
1.0000 | ORAL_TABLET | Freq: Three times a day (TID) | ORAL | Status: DC
  Administered 2018-02-26 (×2): 1 via ORAL
  Filled 2018-02-26 (×3): qty 1

## 2018-02-26 MED ORDER — PRAMIPEXOLE DIHYDROCHLORIDE 0.125 MG PO TABS
0.2500 mg | ORAL_TABLET | Freq: Every day | ORAL | Status: DC
  Administered 2018-02-26: 0.25 mg via ORAL
  Filled 2018-02-26: qty 2

## 2018-02-26 MED ORDER — ACETAMINOPHEN 325 MG PO TABS
650.0000 mg | ORAL_TABLET | Freq: Four times a day (QID) | ORAL | Status: DC | PRN
  Filled 2018-02-26: qty 2

## 2018-02-26 MED ORDER — PIPERACILLIN-TAZOBACTAM 3.375 G IVPB 30 MIN
3.3750 g | Freq: Once | INTRAVENOUS | Status: AC
  Administered 2018-02-26: 3.375 g via INTRAVENOUS
  Filled 2018-02-26: qty 50

## 2018-02-26 MED ORDER — POTASSIUM CHLORIDE 10 MEQ/100ML IV SOLN
10.0000 meq | INTRAVENOUS | Status: AC
  Administered 2018-02-26 (×3): 10 meq via INTRAVENOUS
  Filled 2018-02-26 (×3): qty 100

## 2018-02-26 MED ORDER — ACETAMINOPHEN 650 MG RE SUPP: 650.0000 mg | Freq: Four times a day (QID) | RECTAL | Status: DC | PRN

## 2018-02-26 NOTE — Progress Notes (Signed)
Pharmacy Antibiotic Note  Leah Crane is a 85 y.o. female admitted on 02/26/2018 with intra abdominal infection.  Pharmacy has been consulted for zosyn dosing.  Plan: Zosyn 3.375g IV q8h (4 hour infusion).  F/u renal function, cultures and clinical course     Temp (24hrs), Avg:97.6 F (36.4 C), Min:97.6 F (36.4 C), Max:97.6 F (36.4 C)  Recent Labs  Lab 02/26/18 0605  WBC 6.2  CREATININE 1.46*    CrCl cannot be calculated (Unknown ideal weight.).    No Known Allergies    Thank you for allowing pharmacy to be a part of this patient's care.  Woodfin Ganja 02/26/2018 7:35 AM

## 2018-02-26 NOTE — H&P (Signed)
History and Physical    Leah Crane WUJ:[id number] DOB: Jun 22, 1926 DOA: 02/26/2018  PCP: Olen CordialHarris, Sydney M, MD  Patient coming from: [facility] [place], hospital transfer.  I have personally briefly reviewed patient's old medical records in Epic and Chart everywhere.   Chief Complaint: Abdominal pain.  Diagnosis of choledocholithiasis with acute cholecystitis.  HPI: Leah Ruddith Macnaughton is a 85 y.o. female with medical history significant of CKD stage III, baseline creatinine about 1.1, anemia of chronic disease, chronic venous insufficiency, peripheral arterial disease, hypertension, hyperlipidemia who was admitted to the outside hospital on 02/23/2018 for intractable right lower extremity pain, erythema and swelling with concern for cellulitis.  She was treated with Keflex as outpatient with no improvement.  She was initially admitted as failed outpatient therapy for IV antibiotics.  On initial admission, she had no abdominal pain.  Her liver function test were normal.  Bilateral lower extremity duplex was negative for DVT.  Patient received pain medications in the emergency room for legs.  She was started on Zosyn and admitted to hospital for treatment of cellulitis.  On second day of admission, her routine morning labs showed her liver function enzymes increased with AST 188, ALT 219, bilirubin 1.6.  Patient was without any symptoms.  She had acute hepatitis panel done that was negative.  Right upper quadrant ultrasound was ordered and showed that the gallbladder was distended and had gallbladder wall thickening of 6.6 mm.  After all this diagnosis, patient started complaining of abdominal pain.  According to daughters at the bedside, patient is poor historian.  She might have been uncomfortable but not complaining of abdominal pain.  Patient did develop severe abdominal pain on the night of 02/24/2018 and received multiple doses of pain medications.  She had only  one episode of pain attack that has resolved  since then.  CT scan of the abdomen pelvis was done with contrast that showed dilated common bile duct measuring 12 mm with questionable 6 mm stone within the distal common bile duct.  Distended gallbladder.  Needing for ERCP and cholecystectomy, patient was transferred to [facility].  I received patient as admission at MedSurg floor.  Accompanied by 2 daughters and granddaughter.  Reviewed her outside facility chart, images CD unable to read by me.  History verified. At this time, patient is pain-free for last 24 hours.  She just feels dry mouth, denies any other symptoms. Denies any headache, nausea, vomiting.  She denies any chest pain or shortness of breath.  She has some generalized weakness, joint pains.  She has chronic joint pain.  She has ecchymosis all over as she is also on prednisone long-term and had multiple Heparin subcu injections.  Her last bowel movement was Monday morning.   Ultrasound abdomen: 02/24/2018: Gallbladder is distended with gallbladder wall thickening of 6.6 mm. CT scan abdomen pelvis with contrast 02/24/2018: Dilated common bile duct 12 mm, dilated common hepatic duct 14 mm, questionable 6 mm stone within the distal common bile duct, distended gallbladder with no gallbladder wall thickening or calcified gallstones.  ED Course: As mentioned above.  Patient has received Zosyn since last 72 hours.  Review of Systems: As per HPI otherwise 10 point review of systems negative.  Currently denies any symptoms other than feeling weak and dry mouth.  Denies any abdominal pain.   Past Medical History:  Diagnosis Date  . Arthritis   . Chronic constipation   . GERD (gastroesophageal reflux disease)   . History of hiatal hernia   .  Hypertension   . Hypokalemia   . Multiple thyroid nodules   . Peripheral edema   . Restless leg     Past Surgical History:  Procedure Laterality Date  . ABDOMINAL HYSTERECTOMY    . HERNIA REPAIR    . HIATAL HERNIA REPAIR    . REPLACEMENT  TOTAL KNEE       reports that she has never smoked. She has never used smokeless tobacco. She reports that she does not drink alcohol or use drugs.  No Known Allergies  Family History  Problem Relation Age of Onset  . Cancer - Lung Father      Prior to Admission medications   Medication Sig Start Date End Date Taking? Authorizing Provider  aspirin EC 81 MG tablet Take 81 mg by mouth 4 (four) times a week. MWF, and Sundays    [provider]  Calcium Carb-Cholecalciferol (CALCIUM 600+D) 600-800 MG-UNIT TABS Take 1 tablet by mouth 2 (two) times daily.    [provider]  felodipine (PLENDIL) 10 MG 24 hr tablet Take 10 mg by mouth every morning.  01/05/15   [provider]  furosemide (LASIX) 40 MG tablet Take 80 mg by mouth 3 (three) times a week. Tuesdays, and Saturdays, depends on fluid    [provider]  hydrochlorothiazide (HYDRODIURIL) 25 MG tablet Take 25 mg by mouth every morning.    [provider]  HYDROcodone-acetaminophen (NORCO) 10-325 MG tablet Take 1 tablet by mouth 4 (four) times daily as needed for moderate pain or severe pain.     [provider]  hydroxychloroquine (PLAQUENIL) 200 MG tablet Take 200 mg by mouth every morning.     [provider]  metolazone (ZAROXOLYN) 2.5 MG tablet Take 2.5 mg by mouth every Monday, Wednesday, and Friday.    [provider]  nystatin-triamcinolone (MYCOLOG II) cream APPLY TO AFFECTED AREA TWICE A DAY 03/26/17   [provider]  omeprazole (PRILOSEC) 20 MG capsule Take 20 mg by mouth every morning.    [provider]  piroxicam (FELDENE) 10 MG capsule Take 10 mg by mouth every morning.    [provider]  potassium chloride SA (K-DUR,KLOR-CON) 20 MEQ tablet Take 20 mEq by mouth every morning.     [provider]  pramipexole (MIRAPEX) 0.25 MG tablet TAKE ONE TABLET BY MOUTH BEFORE BEDTIME 03/21/17   [provider]  predniSONE  (DELTASONE) 5 MG tablet Take 5 mg by mouth every morning.    [provider]  quinapril (ACCUPRIL) 40 MG tablet Take 40 mg by mouth 2 (two) times daily.    [provider]  ranitidine (ZANTAC) 150 MG tablet Take 150 mg by mouth daily at 6 PM.    [provider]  senna (SENOKOT) 8.6 MG TABS tablet Take 1 tablet by mouth 2 (two) times daily.    [provider]  tiZANidine (ZANAFLEX) 4 MG tablet Take 4 mg by mouth 2 (two) times daily. Morning and at bedtime. *prescribed three times daily    [provider]  vitamin B-12 (CYANOCOBALAMIN) 1000 MCG tablet Take 1,000 mcg by mouth every morning.    [provider]    Physical Exam: Vitals:   02/26/18 0530  BP: (!) 125/57  Pulse: 70  Resp: (!) 24  Temp: 97.6 F (36.4 C)  TempSrc: Oral  SpO2: 97%    Constitutional: NAD, calm, comfortable Vitals:   02/26/18 0530  BP: (!) 125/57  Pulse: 70  Resp: (!) 24  Temp: 97.6 F (36.4 C)  TempSrc: Oral  SpO2: 97%   Eyes: PERRL, lids and conjunctivae normal ENMT: Mucous membranes are dry. Posterior pharynx clear of any exudate or lesions.Normal dentition.  Neck: normal, supple, no masses, no thyromegaly Respiratory: clear to auscultation bilaterally, no wheezing, no crackles. Normal respiratory effort. No accessory muscle use.  Cardiovascular: Regular rate and rhythm, no murmurs / rubs / gallops. No extremity edema. 2+ pedal pulses. No carotid bruits.  Abdomen: she does have moderate tenderness along the right UQ. Positive murphy's sign. No hepatosplenomegaly. Bowel sounds positive.  Musculoskeletal: no clubbing / cyanosis. No joint deformity upper and lower extremities. Good ROM, no contractures. Normal muscle tone.  Skin: no rashes, lesions, ulcers. No induration, pigmentation and venous stasis changes both LE.  Neurologic: CN 2-12 grossly intact. Sensation intact, DTR normal. Strength 5/5 in all 4.  Psychiatric: Normal judgment and insight.  Alert and oriented x 3. Sleepy.    Labs on Admission: I have personally reviewed following labs and imaging studies As well outside records.  CBC: Recent Labs  Lab 02/26/18 0605  WBC 6.2  NEUTROABS 4.9  HGB 8.6*  HCT 25.9*  MCV 95.6  PLT 156   Basic Metabolic Panel: Recent Labs  Lab 02/26/18 0605  NA 132*  K 3.3*  CL 96*  CO2 27  GLUCOSE 119*  BUN 19  CREATININE 1.46*  CALCIUM 8.6*   GFR: CrCl cannot be calculated (Unknown ideal weight.). Liver Function Tests: Recent Labs  Lab 02/26/18 0605  AST 51*  ALT 106*  ALKPHOS 65  BILITOT 0.9  PROT 5.5*  ALBUMIN 2.7*   No results for input(s): LIPASE, AMYLASE in the last 168 hours. No results for input(s): AMMONIA in the last 168 hours. Coagulation Profile: Recent Labs  Lab 02/26/18 0605  INR 1.08   Cardiac Enzymes: No results for input(s): CKTOTAL, CKMB, CKMBINDEX, TROPONINI in the last 168 hours. BNP (last 3 results) No results for input(s): PROBNP in the last 8760 hours. HbA1C: No results for input(s): HGBA1C in the last 72 hours. CBG: No results for input(s): GLUCAP in the last 168 hours. Lipid Profile: No results for input(s): CHOL, HDL, LDLCALC, TRIG, CHOLHDL, LDLDIRECT in the last 72 hours. Thyroid Function Tests: No results for input(s): TSH, T4TOTAL, FREET4, T3FREE, THYROIDAB in the last 72 hours. Anemia Panel: No results for input(s): VITAMINB12, FOLATE, FERRITIN, TIBC, IRON, RETICCTPCT in the last 72 hours. Urine analysis:    Component Value Date/Time   COLORURINE YELLOW 12/30/2015 2000   APPEARANCEUR CLEAR 12/30/2015 2000   LABSPEC 1.010 12/30/2015 2000   PHURINE 6.0 12/30/2015 2000   GLUCOSEU NEGATIVE 12/30/2015 2000   HGBUR NEGATIVE 12/30/2015 2000   BILIRUBINUR NEGATIVE 12/30/2015 2000   KETONESUR NEGATIVE 12/30/2015 2000   PROTEINUR NEGATIVE 12/30/2015 2000   NITRITE NEGATIVE 12/30/2015 2000   LEUKOCYTESUR NEGATIVE 12/30/2015 2000    Radiological Exams on Admission: No results  found. Outside record available.   Assessment/Plan Principal Problem:   Choledocholithiasis with acute cholecystitis with obstruction Active Problems:   AKI (acute kidney injury) (HCC)   Hypertension   Hypokalemia   Venous stasis dermatitis of both lower extremities   RA (rheumatoid arthritis) (HCC)   Choledocholithiasis with acute cholecystitis with obstruction: Improving liver function test in last 24 hours. Currently clinically stable.  Patient will be admitted to MedSurg.  Continue n.p.o., IV fluids, adequate pain medications as needed.  Will continue IV Zosyn for acute intra-abdominal infection. Repeat liver function test already downtrending.  Bilirubin normal.  Unsure whether patient will need MRCP/ERCP before dealing with gallbladder. I called and discussed case with general surgery, they will see patient in consultation and will follow the recommendations.  Acute kidney injury: With history of chronic kidney disease stage III, baseline creatinine about 1.1-1.2.  Probably multifactorial.  Will hydrate aggressively, intake and output monitoring, recheck levels in the morning.  Acute on chronic anemia of chronic disease: Her hemoglobin in our records 10 months ago was 11.5.  Probably chronic anemia.  No evidence of active bleeding.  We will continue to monitor.  Currently no indication for transfusion.  Rheumatoid arthritis: On Plaquenil and chronic steroids.  Currently blood pressures are stable.  She can continue her oral prednisone.  If she goes to prolonged surgery, may need a stress dose of hydrocortisone.  Hypokalemia: We will replace with IV.  Patient is n.p.o.  Chronic venous stasis: No evidence of active infection.  Continue leg elevation, compression with TED hose.  DVT prophylaxis: SCDs. Code Status: Full code. Family Communication: 2 daughters and granddaughter at the bedside. Disposition Plan: Home with home care. Consults called: General surgery. Admission  status: Inpatient.   Dorcas Carrow MD [facility] Pager [phone]  If 7PM-7AM, please contact night-coverage [email or web address] Password [email or web address]  02/26/2018, 8:15 AM

## 2018-02-26 NOTE — Consult Note (Addendum)
Cardiology Consultation:   Patient ID: Leah Crane MRN: [id number]; DOB: [birth year]  Admit date: 02/26/2018 Date of Consult: 02/26/2018  Primary Care Provider: Olen CordialHarris, Sydney M, MD [place] Cardi[place] .   At [facility],   Primary Electrophysiologist:  None     Patient Profile:   Leah Crane is a 85 y.o. female with a hx of hypertension, hyperlipidemia, peripheral arterial disease, venous insufficiency who is being seen today for the evaluation of preoperative evaluation at the request of  Dr.  Dorcas CarrowKuber Ghimire.  History of Present Illness:   Ms. Leah Crane is an 85 year old female who was transferred [facility] with concern for choledocholithiasis.  Patient presented to [facility] on January 6 with intractable right lower extremity pain and edema. The following day she developed severe abdominal pain, nausea and vomiting and dry heaves.  Her liver enzymes were elevated and an ultrasound of the abdomen revealed a distended gallbladder with wall thickening. Patient was transferred to [facility] for further management.  The patient lives in her own home but has many daughters who live close by.  They stay with her at night.  She does not do her shopping any longer.  She is able to move around in her house and go from the bathroom to bedroom into the den with the help of a walker.  She is never had any episodes of chest pain or shortness of breath.  She had an echocardi[facility] at [facility].  I do not have records for that but 1 of the daughters remembered that her heart function was 60%.  I assume that this is an ejection fraction of 60%.  She was told that she had a heart murmur.  Denies any episodes of syncope or presyncope.  Does have mild dementia. Prior to this GI illness, she did not have any real chronic complaints.  Past Medical History:  Diagnosis Date  . Arthritis   . Chronic constipation   . GERD (gastroesophageal reflux disease)     . History of hiatal hernia   . Hypertension   . Hypokalemia   . Multiple thyroid nodules   . Peripheral edema   . Restless leg     Past Surgical History:  Procedure Laterality Date  . ABDOMINAL HYSTERECTOMY    . HERNIA REPAIR    . HIATAL HERNIA REPAIR    . REPLACEMENT TOTAL KNEE       Home Medications:  Prior to Admission medications   Medication Sig Start Date End Date Taking? Authorizing Provider  aspirin EC 81 MG tablet Take 81 mg by mouth See admin instructions. MON, WED, FRI, SUN   Yes [provider]  Calcium Carb-Cholecalciferol (CALCIUM 600+D) 600-800 MG-UNIT TABS Take 1 tablet by mouth 2 (two) times daily.   Yes [provider]  felodipine (PLENDIL) 10 MG 24 hr tablet Take 10 mg by mouth every morning.  01/05/15  Yes [provider]  Ferrous Fumarate (HEMOCYTE - 106 MG FE) 324 (106 Fe) MG TABS tablet Take 1 tablet by mouth every other day.   Yes [provider]  furosemide (LASIX) 40 MG tablet Take 40 mg by mouth See admin instructions. Mondays and Fridays   Yes [provider]  hydrochlorothiazide (HYDRODIURIL) 25 MG tablet Take 25 mg by mouth every morning.   Yes [provider]  HYDROcodone-acetaminophen (NORCO) 10-325 MG tablet Take 1 tablet by mouth 3 (three) times daily.    Yes [provider]  hydroxychloroquine (PLAQUENIL) 200 MG tablet Take  200 mg by mouth every morning.    Yes [provider]  metolazone (ZAROXOLYN) 2.5 MG tablet Take 2.5 mg by mouth every Monday, Wednesday, and Friday.   Yes [provider]  omeprazole (PRILOSEC) 20 MG capsule Take 20 mg by mouth every morning.   Yes [provider]  potassium chloride SA (K-DUR,KLOR-CON) 20 MEQ tablet Take 20 mEq by mouth 2 (two) times daily.    Yes [provider]  pramipexole (MIRAPEX) 0.25 MG tablet Take 0.25 mg by mouth at bedtime.  03/21/17  Yes [provider]  predniSONE (DELTASONE) 5 MG tablet Take 5  mg by mouth every morning.   Yes [provider]  psyllium (HYDROCIL/METAMUCIL) 95 % PACK Take 1 packet by mouth daily.   Yes [provider]  quinapril (ACCUPRIL) 40 MG tablet Take 40 mg by mouth 2 (two) times daily.   Yes [provider]  ranitidine (ZANTAC) 150 MG tablet Take 150 mg by mouth daily at 6 PM.   Yes [provider]  senna (SENOKOT) 8.6 MG TABS tablet Take 2 tablets by mouth 2 (two) times daily.    Yes [provider]  tiZANidine (ZANAFLEX) 4 MG tablet Take 4 mg by mouth 3 (three) times daily.    Yes [provider]  vitamin B-12 (CYANOCOBALAMIN) 1000 MCG tablet Take 1,000 mcg by mouth every morning.   Yes [provider]    Inpatient Medications: Scheduled Meds: . predniSONE  5 mg Oral Q breakfast   Continuous Infusions: . sodium chloride 125 mL/hr at 02/26/18 0910  . piperacillin-tazobactam (ZOSYN)  IV    . potassium chloride 10 mEq (02/26/18 1041)   PRN Meds: acetaminophen **OR** acetaminophen, ondansetron **OR** ondansetron (ZOFRAN) IV  Allergies:   No Known Allergies  Social History:   Social History   Socioeconomic History  . Marital status: Married    Spouse name: Not on file  . Number of children: Not on file  . Years of education: Not on file  . Highest education level: Not on file  Occupational History  . Not on file  Social Needs  . Financial resource strain: Not on file  . Food insecurity:    Worry: Not on file    Inability: Not on file  . Transportation needs:    Medical: Not on file    Non-medical: Not on file  Tobacco Use  . Smoking status: Never Smoker  . Smokeless tobacco: Never Used  Substance and Sexual Activity  . Alcohol use: No  . Drug use: No  . Sexual activity: Never  Lifestyle  . Physical activity:    Days per week: Not on file    Minutes per session: Not on file  . Stress: Not on file  Relationships  . Social connections:    Talks on phone: Not on file     Gets together: Not on file    Attends religious service: Not on file    Active member of club or organization: Not on file    Attends meetings of clubs or organizations: Not on file    Relationship status: Not on file  . Intimate partner violence:    Fear of current or ex partner: Not on file    Emotionally abused: Not on file    Physically abused: Not on file    Forced sexual activity: Not on file  Other Topics Concern  . Not on file  Social History Narrative  . Not on file  Family History:    Family History  Problem Relation Age of Onset  . Cancer - Lung Father      ROS:  Please see the history of present illness.   All other ROS reviewed and negative.     Physical Exam/Data:   Vitals:   02/26/18 0530 02/26/18 0859 02/26/18 0901 02/26/18 1000  BP: (!) 125/57  (!) 111/41   Pulse: 70  72   Resp: (!) 24  18   Temp: 97.6 F (36.4 C) 97.9 F (36.6 C)    TempSrc: Oral Axillary    SpO2: 97%  100%   Weight:    85.4 kg  Height:    5\' 7"  (1.702 m)   No intake or output data in the 24 hours ending 02/26/18 1056 Filed Weights   02/26/18 1000  Weight: 85.4 kg   Body mass index is 29.49 kg/m.  General:   Elderly female , NAD , mildly demented HEENT: normal Lymph: no adenopathy Neck: no JVD Endocrine:  No thryomegaly Vascular: No carotid bruits; FA pulses 2+ bilaterally without bruits  Cardiac:   Rate S1-S2.  She has a 2/6 systolic murmur with radiation to the axilla. Lungs:  clear to auscultation bilaterally, no wheezing, rhonchi or rales  Abd: Loose bowel sounds, mildly tender Ext: no edema Musculoskeletal:  No deformities, BUE and BLE strength normal and equal Skin: warm and dry  Neuro:  CNs 2-12 intact, no focal abnormalities noted Psych:  Normal affect   EKG:  The EKG was personally reviewed and demonstrates: Normal sinus rhythm.  Poor R wave progression.  I doubt that this is due to an anterior wall myocardial infarction.  Likely due to lead  placement Telemetry:  Telemetry was personally reviewed and demonstrates:   Normal sinus rhythm  Relevant CV Studies:   Laboratory Data:  Chemistry Recent Labs  Lab 02/26/18 0605  NA 132*  K 3.3*  CL 96*  CO2 27  GLUCOSE 119*  BUN 19  CREATININE 1.46*  CALCIUM 8.6*  GFRNONAA 32*  GFRAA 37*  ANIONGAP 9    Recent Labs  Lab 02/26/18 0605  PROT 5.5*  ALBUMIN 2.7*  AST 51*  ALT 106*  ALKPHOS 65  BILITOT 0.9   Hematology Recent Labs  Lab 02/26/18 0605  WBC 6.2  RBC 2.71*  HGB 8.6*  HCT 25.9*  MCV 95.6  MCH 31.7  MCHC 33.2  RDW 12.2  PLT 156   Cardiac EnzymesNo results for input(s): TROPONINI in the last 168 hours. No results for input(s): TROPIPOC in the last 168 hours.  BNPNo results for input(s): BNP, PROBNP in the last 168 hours.  DDimer No results for input(s): DDIMER in the last 168 hours.  Radiology/Studies:  No results found.  Assessment and Plan:   1. Preoperative evaluation: The patient is an 85 year old female who presents with choledocho lithiasis.  Initially acutely ill but now has improved slightly.  Asked to see her today for preoperative cardiology evaluation.  She has a history of heart murmur and so she sees a cardiologist in [place].  On exam she has a murmur consistent with mitral regurgitation.  Her peripheral pulses are fairly good so I do not think that she has significant degree of aortic stenosis.  She is not very active but she has no problems doing her normal activities.  She denies any chest pain to suggest an acute coronary syndrome.  She is able to lie supine without any difficulties.  The family reports no episodes  of shortness of breath, syncope, or presyncope.  Discussed the case with the surgical team.  Their plan is to do an MRI of the abdomen to further define her anatomy.  They do not anticipate surgery today.  Since we have a time, we will get an echocardiogram so that we have a better understanding of her cardiac function  and valvular function.  My assumption is that her ejection fraction will be fairly well preserved and that we will find noncritical valvular issues including mitral regurgitation.  Unless the echocardiogram has an high risk feature, I think that she should be able to tolerate surgical procedure without excessive risk.  She is at some increased risk given her age but I have not found any high risk cardiac issues.   2.  Mitral regurgitation:  Clinically,  the patient has mitral regurgitation.  MR is extremely well tolerated during anesthesia and  surgery and should not significantly increase her risk from a surgical or anesthesia standpoint.  For questions or updates, please contact [facility] Please consult [email or web address] for contact info under     Signed, Kristeen Miss, MD  02/26/2018 10:56 AM

## 2018-02-26 NOTE — Progress Notes (Signed)
Patient unable to tolerate exam stating leg pain was too severe and test was too long. Got limited imaging and will send through for radiologist to review.

## 2018-02-26 NOTE — Consult Note (Addendum)
[facility] Surgery Consult Note  Leah Crane 1926-08-17  [id number].    Requesting MD: Barb Merino Chief Complaint/Reason for Consult: gallstones  HPI:  Leah Crane is an 85yo female PMH CKD-3, HTN, HLD, GERD, PAD, venous insufficiency, and arthritis on daily prednisone 56m, [facility].   [facility] intractable right lower extremity pain, erythema, and swelling concerning for cellulitis that failed outpatient antibiotics/keflex. She was started on IV zosyn. On 1/7 the patient developed abdominal pain, nausea, and vomiting/dry heaves. Labs that AM revealed AST 188, ALT 219, bilirubin 1.1. RUQ u/s was obtained and showed distended gallbladder with wall thickening of 6.673m CT abdomen/ pelvis 1/8 showed dilated common bile duct at 1274mmild intrahepatic biliary dilatation, distended gallbladder without wall thickening, trace pericholecystic fluid, and questionable 6mm81mone within the distal common bile duct. Hepatitis panel negative. [facility]  Daughters at bedside today. Patient states that she is still having right sided abdominal pain. Some nausea, no more emesis. She has had nothing to eat/drink today. Denies symptoms of biliary colic, but states that she does have issues with chronic constipation for which she takes metamucil/senna daily.  Labs today AST 51, ALT 106, alk phos 65, bilirubin 0.9, WBC 6.2, creatinine 1.46, hemoglobin/hematocrit 8.6/25.9.   -Abdominal surgical history: hiatal hernia repair, hysterectomy -No h/o stroke or MI -Family reports ECHO done in 2019, unsure of results -Anticoagulants: none -Tobacco use- dip -Lives at home alone, has multiple family members who check on her every day, ambulates limited amount with walker, also has a lift chair at home  ROS: Review of Systems   Constitutional: Negative.   HENT: Positive for hearing loss.   Eyes: Negative.   Respiratory: Negative.   Cardiovascular: Positive for leg swelling. Negative for chest pain.  Gastrointestinal: Positive for abdominal pain, constipation, nausea and vomiting. Negative for diarrhea.  Genitourinary: Negative.   Musculoskeletal: Positive for back pain and joint pain.  Skin: Positive for rash.  Neurological: Negative.    All systems reviewed and otherwise negative except for as above  Family History  Problem Relation Age of Onset  . Cancer - Lung Father     Past Medical History:  Diagnosis Date  . Arthritis   . Chronic constipation   . GERD (gastroesophageal reflux disease)   . History of hiatal hernia   . Hypertension   . Hypokalemia   . Multiple thyroid nodules   . Peripheral edema   . Restless leg     Past Surgical History:  Procedure Laterality Date  . ABDOMINAL HYSTERECTOMY    . HERNIA REPAIR    . HIATAL HERNIA REPAIR    . REPLACEMENT TOTAL KNEE      Social History:  reports that she has never smoked. She has never used smokeless tobacco. She reports that she does not drink alcohol or use drugs.  Allergies: No Known Allergies  Medications Prior to Admission  Medication Sig Dispense Refill  . aspirin EC 81 MG tablet Take 81 mg by mouth 4 (four) times a week. MWF, and Sundays    . Calcium Carb-Cholecalciferol (CALCIUM 600+D) 600-800 MG-UNIT TABS Take 1 tablet by mouth 2 (two) times daily.    . felodipine (PLENDIL) 10 MG 24 hr tablet Take 10 mg by mouth every morning.   4  . furosemide (LASIX) 40 MG tablet Take 80 mg by mouth 3 (three) times a  week. Tuesdays, and Saturdays, depends on fluid    . hydrochlorothiazide (HYDRODIURIL) 25 MG tablet Take 25 mg by mouth every morning.    Marland Kitchen HYDROcodone-acetaminophen (NORCO) 10-325 MG tablet Take 1 tablet by mouth 4 (four) times daily as needed for moderate pain or severe pain.     . hydroxychloroquine (PLAQUENIL) 200 MG tablet  Take 200 mg by mouth every morning.     . metolazone (ZAROXOLYN) 2.5 MG tablet Take 2.5 mg by mouth every Monday, Wednesday, and Friday.    . nystatin-triamcinolone (MYCOLOG II) cream APPLY TO AFFECTED AREA TWICE A DAY  6  . omeprazole (PRILOSEC) 20 MG capsule Take 20 mg by mouth every morning.    . piroxicam (FELDENE) 10 MG capsule Take 10 mg by mouth every morning.    . potassium chloride SA (K-DUR,KLOR-CON) 20 MEQ tablet Take 20 mEq by mouth every morning.     . pramipexole (MIRAPEX) 0.25 MG tablet TAKE ONE TABLET BY MOUTH BEFORE BEDTIME  3  . predniSONE (DELTASONE) 5 MG tablet Take 5 mg by mouth every morning.    . quinapril (ACCUPRIL) 40 MG tablet Take 40 mg by mouth 2 (two) times daily.    . ranitidine (ZANTAC) 150 MG tablet Take 150 mg by mouth daily at 6 PM.    . senna (SENOKOT) 8.6 MG TABS tablet Take 1 tablet by mouth 2 (two) times daily.    Marland Kitchen tiZANidine (ZANAFLEX) 4 MG tablet Take 4 mg by mouth 2 (two) times daily. Morning and at bedtime. *prescribed three times daily    . vitamin B-12 (CYANOCOBALAMIN) 1000 MCG tablet Take 1,000 mcg by mouth every morning.      Prior to Admission medications   Medication Sig Start Date End Date Taking? Authorizing Provider  aspirin EC 81 MG tablet Take 81 mg by mouth 4 (four) times a week. MWF, and Sundays    [provider]  Calcium Carb-Cholecalciferol (CALCIUM 600+D) 600-800 MG-UNIT TABS Take 1 tablet by mouth 2 (two) times daily.    [provider]  felodipine (PLENDIL) 10 MG 24 hr tablet Take 10 mg by mouth every morning.  01/05/15   [provider]  furosemide (LASIX) 40 MG tablet Take 80 mg by mouth 3 (three) times a week. Tuesdays, and Saturdays, depends on fluid    [provider]  hydrochlorothiazide (HYDRODIURIL) 25 MG tablet Take 25 mg by mouth every morning.    [provider]  HYDROcodone-acetaminophen (NORCO) 10-325 MG tablet Take 1 tablet by mouth 4 (four) times daily as needed for moderate  pain or severe pain.     [provider]  hydroxychloroquine (PLAQUENIL) 200 MG tablet Take 200 mg by mouth every morning.     [provider]  metolazone (ZAROXOLYN) 2.5 MG tablet Take 2.5 mg by mouth every Monday, Wednesday, and Friday.    [provider]  nystatin-triamcinolone (MYCOLOG II) cream APPLY TO AFFECTED AREA TWICE A DAY 03/26/17   [provider]  omeprazole (PRILOSEC) 20 MG capsule Take 20 mg by mouth every morning.    [provider]  piroxicam (FELDENE) 10 MG capsule Take 10 mg by mouth every morning.    [provider]  potassium chloride SA (K-DUR,KLOR-CON) 20 MEQ tablet Take 20 mEq by mouth every morning.     [provider]  pramipexole (MIRAPEX) 0.25 MG tablet TAKE ONE TABLET BY MOUTH BEFORE BEDTIME 03/21/17   [provider]  predniSONE (DELTASONE) 5 MG tablet Take 5 mg by  mouth every morning.    [provider]  quinapril (ACCUPRIL) 40 MG tablet Take 40 mg by mouth 2 (two) times daily.    [provider]  ranitidine (ZANTAC) 150 MG tablet Take 150 mg by mouth daily at 6 PM.    [provider]  senna (SENOKOT) 8.6 MG TABS tablet Take 1 tablet by mouth 2 (two) times daily.    [provider]  tiZANidine (ZANAFLEX) 4 MG tablet Take 4 mg by mouth 2 (two) times daily. Morning and at bedtime. *prescribed three times daily    [provider]  vitamin B-12 (CYANOCOBALAMIN) 1000 MCG tablet Take 1,000 mcg by mouth every morning.    [provider]    Blood pressure (!) 111/41, pulse 72, temperature 97.9 F (36.6 C), temperature source Axillary, resp. rate 18, SpO2 100 %. Physical Exam: General: pleasant, frail, WD/WN elderly white female who is laying in bed in NAD HEENT: head is normocephalic, atraumatic.  Sclera are noninjected.  Pupils equal and round.  Ears and nose without any masses or lesions.  Mouth is pink and moist. Edentate Heart: regular, rate, and  rhythm.  +murmur. Palpable pedal pulses bilaterally Lungs: CTAB, no wheezes, rhonchi, or rales noted.  Respiratory effort nonlabored Abd: well healed midline incision, soft, ND, +BS, no masses, hernias, or organomegaly. +TTP RLQ/RUQ with guarding MS: 1+ edema BLE, chronic venous stasis changes to BLE Skin: warm and dry  Psych: Alert, tired Neuro: cranial nerves grossly intact, extremity CSM intact bilaterally, normal speech  Results for orders placed or performed during the hospital encounter of 02/26/18 (from the past 48 hour(s))  Type and screen Stevens     Status: None   Collection Time: 02/26/18  5:46 AM  Result Value Ref Range   ABO/RH(D) O POS    Antibody Screen NEG    Sample Expiration      03/01/2018 Performed at [facility], [place] [address], [place], [place] [address]   ABO/Rh     Status: None   Collection Time: 02/26/18  5:46 AM  Result Value Ref Range   ABO/RH(D)      O POS Performed at [facility] [address], [place], [place] [address]   Hepatic function panel     Status: Abnormal   Collection Time: 02/26/18  6:05 AM  Result Value Ref Range   Total Protein 5.5 (L) 6.5 - 8.1 g/dL   Albumin 2.7 (L) 3.5 - 5.0 g/dL   AST 51 (H) 15 - 41 U/L   ALT 106 (H) 0 - 44 U/L   Alkaline Phosphatase 65 38 - 126 U/L   Total Bilirubin 0.9 0.3 - 1.2 mg/dL   Bilirubin, Direct 0.2 0.0 - 0.2 mg/dL   Indirect Bilirubin 0.7 0.3 - 0.9 mg/dL    Comment: Performed at [facility], [place] [address] [place], [place] [address]  CBC with Differential/Platelet     Status: Abnormal   Collection Time: 02/26/18  6:05 AM  Result Value Ref Range   WBC 6.2 4.0 - 10.5 K/uL   RBC 2.71 (L) 3.87 - 5.11 MIL/uL   Hemoglobin 8.6 (L) 12.0 - 15.0 g/dL   HCT 25.9 (L) 36.0 - 46.0 %   MCV 95.6 80.0 - 100.0 fL   MCH 31.7 26.0 - 34.0 pg   MCHC 33.2 30.0 - 36.0 g/dL   RDW 12.2 11.5 - 15.5 %   Platelets 156 150 - 400 K/uL  nRBC 0.0 0.0 - 0.2 %   Neutrophils  Relative % 79 %   Neutro Abs 4.9 1.7 - 7.7 K/uL   Lymphocytes Relative 5 %   Lymphs Abs 0.3 (L) 0.7 - 4.0 K/uL   Monocytes Relative 14 %   Monocytes Absolute 0.9 0.1 - 1.0 K/uL   Eosinophils Relative 2 %   Eosinophils Absolute 0.1 0.0 - 0.5 K/uL   Basophils Relative 0 %   Basophils Absolute 0.0 0.0 - 0.1 K/uL   Immature Granulocytes 0 %   Abs Immature Granulocytes 0.02 0.00 - 0.07 K/uL    Comment: Performed at [facility] [address], [place], [place] [address]  Basic metabolic panel     Status: Abnormal   Collection Time: 02/26/18  6:05 AM  Result Value Ref Range   Sodium 132 (L) 135 - 145 mmol/L   Potassium 3.3 (L) 3.5 - 5.1 mmol/L   Chloride 96 (L) 98 - 111 mmol/L   CO2 27 22 - 32 mmol/L   Glucose, Bld 119 (H) 70 - 99 mg/dL   BUN 19 8 - 23 mg/dL   Creatinine, Ser 1.46 (H) 0.44 - 1.00 mg/dL   Calcium 8.6 (L) 8.9 - 10.3 mg/dL   GFR calc non Af Amer 32 (L) >60 mL/min   GFR calc Af Amer 37 (L) >60 mL/min   Anion gap 9 5 - 15    Comment: Performed at [facility] [address], [place], [place] [address]  Protime-INR     Status: None   Collection Time: 02/26/18  6:05 AM  Result Value Ref Range   Prothrombin Time 13.9 11.4 - 15.2 seconds   INR 1.08     Comment: Performed at [facility] [address] [place], [place] [address]   No results found.  Anti-infectives (From admission, onward)   Start     Dose/Rate Route Frequency Ordered Stop   02/26/18 1400  piperacillin-tazobactam (ZOSYN) IVPB 3.375 g     3.375 g 12.5 mL/hr over 240 Minutes Intravenous Every 8 hours 02/26/18 0734     02/26/18 0545  piperacillin-tazobactam (ZOSYN) IVPB 3.375 g     3.375 g 100 mL/hr over 30 Minutes Intravenous  Once 02/26/18 0531 02/26/18 0733       Assessment/Plan CKD-3 with AKI - Cr 1.46 Chronic anemia  HTN HLD GERD PAD Venous insufficiency Arthritis on daily prednisone 16m and plaquenil Chronic pain - take Norco 10/3250m2-3x daily Chronic constipation  - takes metamucil and senna daily at home  Acute cholecystitis Elevated LFTs, possible choledocholithiasis - Patient with signs of acute cholecystitis on imaging and exam. LFTs were elevated but are trending down, bilirubin now within normal limits. U/s and CT do not show any definite gallstones, will order MRCP for further evaluation. Continue to trend LFTs. With patient's age and MMP will ask cardiology to see for cardiac clearance prior to proceeding with surgery. Keep NPO for now and continue antibiotics.  ID - zosyn 1/6>> VTE - SCDs FEN - IVF, NPO Foley - wick  BrWellington HampshirePASierra [facility] 02/26/2018, 9:20 AM Pager: [phone] 7:00 am -11:30 AM Tues-Fri 7:00 am-4:30 pm Sat-Sun 7:00 am-11:30 am

## 2018-02-26 NOTE — Progress Notes (Signed)
Pt's Legs are hurting badly from her RLS and she is hungry family asked RN to page MD and I did awaiting call back or orders.

## 2018-02-27 ENCOUNTER — Inpatient Hospital Stay (HOSPITAL_COMMUNITY): Payer: Medicare Other

## 2018-02-27 DIAGNOSIS — R945 Abnormal results of liver function studies: Secondary | ICD-10-CM

## 2018-02-27 DIAGNOSIS — N179 Acute kidney failure, unspecified: Secondary | ICD-10-CM | POA: Diagnosis not present

## 2018-02-27 DIAGNOSIS — K8043 Calculus of bile duct with acute cholecystitis with obstruction: Secondary | ICD-10-CM | POA: Diagnosis not present

## 2018-02-27 LAB — COMPREHENSIVE METABOLIC PANEL
ALT: 86 U/L — ABNORMAL HIGH (ref 0–44)
AST: 43 U/L — AB (ref 15–41)
Albumin: 2.5 g/dL — ABNORMAL LOW (ref 3.5–5.0)
Alkaline Phosphatase: 71 U/L (ref 38–126)
Anion gap: 8 (ref 5–15)
BUN: 17 mg/dL (ref 8–23)
CHLORIDE: 98 mmol/L (ref 98–111)
CO2: 27 mmol/L (ref 22–32)
Calcium: 8.5 mg/dL — ABNORMAL LOW (ref 8.9–10.3)
Creatinine, Ser: 1.52 mg/dL — ABNORMAL HIGH (ref 0.44–1.00)
GFR calc Af Amer: 35 mL/min — ABNORMAL LOW (ref >60)
GFR calc non Af Amer: 30 mL/min — ABNORMAL LOW (ref >60)
Glucose, Bld: 92 mg/dL (ref 70–99)
Potassium: 3.7 mmol/L (ref 3.5–5.1)
Sodium: 133 mmol/L — ABNORMAL LOW (ref 135–145)
Total Bilirubin: 1 mg/dL (ref 0.3–1.2)
Total Protein: 5.2 g/dL — ABNORMAL LOW (ref 6.5–8.1)

## 2018-02-27 LAB — CBC
HCT: 25.4 % — ABNORMAL LOW (ref 36.0–46.0)
Hemoglobin: 8.2 g/dL — ABNORMAL LOW (ref 12.0–15.0)
MCH: 31.5 pg (ref 26.0–34.0)
MCHC: 32.3 g/dL (ref 30.0–36.0)
MCV: 97.7 fL (ref 80.0–100.0)
NRBC: 0 % (ref 0.0–0.2)
Platelets: 152 10*3/uL (ref 150–400)
RBC: 2.6 MIL/uL — ABNORMAL LOW (ref 3.87–5.11)
RDW: 12.3 % (ref 11.5–15.5)
WBC: 4.7 10*3/uL (ref 4.0–10.5)

## 2018-02-27 MED ORDER — METOLAZONE 2.5 MG PO TABS: 2.5000 mg | ORAL_TABLET | ORAL | 0 refills | Status: AC

## 2018-02-27 MED ORDER — QUINAPRIL HCL 40 MG PO TABS: 40.0000 mg | ORAL_TABLET | Freq: Two times a day (BID) | ORAL | 0 refills | Status: AC

## 2018-02-27 MED ORDER — HYDROCODONE-ACETAMINOPHEN 10-325 MG PO TABS: 1.0000 | ORAL_TABLET | Freq: Four times a day (QID) | ORAL | 0 refills | Status: AC | PRN

## 2018-02-27 MED ORDER — ONDANSETRON HCL 4 MG PO TABS: 4.0000 mg | ORAL_TABLET | Freq: Four times a day (QID) | ORAL | 0 refills | Status: AC | PRN

## 2018-02-27 MED ORDER — SODIUM CHLORIDE 0.9 % IV SOLN: INTRAVENOUS | Status: AC

## 2018-02-27 MED ORDER — AMOXICILLIN-POT CLAVULANATE 875-125 MG PO TABS: 1.0000 | ORAL_TABLET | Freq: Two times a day (BID) | ORAL | 0 refills | Status: AC

## 2018-02-27 MED ORDER — HYDROCHLOROTHIAZIDE 25 MG PO TABS: 25.0000 mg | ORAL_TABLET | Freq: Every morning | ORAL | 0 refills | Status: AC

## 2018-02-27 NOTE — Progress Notes (Addendum)
Pt sat in the chair with x1 mod assist with transfers. Pt's daughters will help pt at home, pt lives with daughter. Discharge instructions given to daughter. Discharged to home.  1900 Norco that was not given to pt, returned to main pharmacy.

## 2018-02-27 NOTE — Discharge Summary (Addendum)
Physician Discharge Summary  Leah Crane MRN: [id number] DOB/AGE: [birth year] 85 y.o.  PCP: Olen CordialHarris, Sydney M, MD   Admit date: 02/26/2018 Discharge date: 02/27/2018  Discharge Diagnoses:    Principal Problem:   Choledocholithiasis with acute cholecystitis with obstruction Active Problems:   AKI (acute kidney injury) (HCC)   Hypertension   Hypokalemia   Venous stasis dermatitis of both lower extremities   RA (rheumatoid arthritis) (HCC)   Nonrheumatic mitral valve regurgitation    Follow-up recommendations Follow-up with PCP in 3-5 days , including all  additional recommended appointments as below Follow-up CBC, CMP in 3-5 days   Allergies as of 02/27/2018   No Known Allergies     Medication List    TAKE these medications   amoxicillin-clavulanate 875-125 MG tablet Commonly known as:  AUGMENTIN Take 1 tablet by mouth 2 (two) times daily for 7 days.   aspirin EC 81 MG tablet Take 81 mg by mouth See admin instructions. MON, WED, FRI, SUN   CALCIUM 600+D 600-800 MG-UNIT Tabs Generic drug:  Calcium Carb-Cholecalciferol Take 1 tablet by mouth 2 (two) times daily.   felodipine 10 MG 24 hr tablet Commonly known as:  PLENDIL Take 10 mg by mouth every morning.   Ferrous Fumarate 324 (106 Fe) MG Tabs tablet Commonly known as:  HEMOCYTE - 106 mg FE Take 1 tablet by mouth every other day.   furosemide 40 MG tablet Commonly known as:  LASIX Take 40 mg by mouth See admin instructions. Mondays and Fridays   hydrochlorothiazide 25 MG tablet Commonly known as:  HYDRODIURIL Take 1 tablet (25 mg total) by mouth every morning. Start taking on:  March 20, 2018 What changed:  These instructions start on March 20, 2018. If you are unsure what to do until then, ask your doctor or other care provider.   HYDROcodone-acetaminophen 10-325 MG tablet Commonly known as:  NORCO Take 1 tablet by mouth every 6 (six) hours as needed for moderate pain or severe pain. What changed:     when to take this  reasons to take this   hydroxychloroquine 200 MG tablet Commonly known as:  PLAQUENIL Take 200 mg by mouth every morning.   metolazone 2.5 MG tablet Commonly known as:  ZAROXOLYN Take 1 tablet (2.5 mg total) by mouth every Monday, Wednesday, and Friday. Start 03/20/18 What changed:  additional instructions   omeprazole 20 MG capsule Commonly known as:  PRILOSEC Take 20 mg by mouth every morning.   ondansetron 4 MG tablet Commonly known as:  ZOFRAN Take 1 tablet (4 mg total) by mouth every 6 (six) hours as needed for nausea.   potassium chloride SA 20 MEQ tablet Commonly known as:  K-DUR,KLOR-CON Take 20 mEq by mouth 2 (two) times daily.   pramipexole 0.25 MG tablet Commonly known as:  MIRAPEX Take 0.25 mg by mouth at bedtime.   predniSONE 5 MG tablet Commonly known as:  DELTASONE Take 5 mg by mouth every morning.   psyllium 95 % Pack Commonly known as:  HYDROCIL/METAMUCIL Take 1 packet by mouth daily.   quinapril 40 MG tablet Commonly known as:  ACCUPRIL Take 1 tablet (40 mg total) by mouth 2 (two) times daily. Start taking on:  March 20, 2018 What changed:  These instructions start on March 20, 2018. If you are unsure what to do until then, ask your doctor or other care provider.   ranitidine 150 MG tablet Commonly known as:  ZANTAC Take 150 mg by mouth daily at 6  PM.   senna 8.6 MG Tabs tablet Commonly known as:  SENOKOT Take 2 tablets by mouth 2 (two) times daily.   tiZANidine 4 MG tablet Commonly known as:  ZANAFLEX Take 4 mg by mouth 3 (three) times daily.   vitamin B-12 1000 MCG tablet Commonly known as:  CYANOCOBALAMIN Take 1,000 mcg by mouth every morning.            Discharge Condition: stable  Discharge Instructions Get Medicines reviewed and adjusted: Please take all your medications with you for your next visit with your Primary MD  Please request your Primary MD to go over all hospital tests and  procedure/radiological results at the follow up, please ask your Primary MD to get all Hospital records sent to his/her office.  If you experience worsening of your admission symptoms, develop shortness of breath, life threatening emergency, suicidal or homicidal thoughts you must seek medical attention immediately by calling 911 or calling your MD immediately  if symptoms less severe.  You must read complete instructions/literature along with all the possible adverse reactions/side effects for all the Medicines you take and that have been prescribed to you. Take any new Medicines after you have completely understood and accpet all the possible adverse reactions/side effects.   Do not drive when taking Pain medications.   Do not take more than prescribed Pain, Sleep and Anxiety Medications  Special Instructions: If you have smoked or chewed Tobacco  in the last 2 yrs please stop smoking, stop any regular Alcohol  and or any Recreational drug use.  Wear Seat belts while driving.  Please note  You were cared for by a hospitalist during your hospital stay. Once you are discharged, your primary care physician will handle any further medical issues. Please note that NO REFILLS for any discharge medications will be authorized once you are discharged, as it is imperative that you return to your primary care physician (or establish a relationship with a primary care physician if you do not have one) for your aftercare needs so that they can reassess your need for medications and monitor your lab values.     No Known Allergies    Disposition: home with daughters   Consults:  General surgery    Significant Diagnostic Studies:  Mr Abdomen Wo Contrast  Result Date: 02/26/2018 CLINICAL DATA:  Elevated LFTs. EXAM: MRI ABDOMEN WITHOUT CONTRAST TECHNIQUE: Multiplanar multisequence MR imaging was performed without the administration of intravenous contrast. COMPARISON:  CT scan 05/08/2017 FINDINGS:  Markedly limited study in that the patient was only able to tolerate 3 pulse sequences due to leg pain. Lower chest: Heart size upper normal to mildly increased. Hepatobiliary: Limited evaluation shows cysts in the inferior liver, similar to prior CT. Intra and extrahepatic biliary duct dilatation is evident. There is probable flow artifact in the distal common bile duct (31/6) in similar flow artifact is seen in the central pancreatic duct on 30/6. No filling defect is evident in the common bile duct on the other 2 T2 weighted sequences. Gallbladder is distended Pancreas: Pancreas diffusely atrophic with diffuse dilatation of the main pancreatic duct measuring up to 6 mm diameter. Spleen:  No splenomegaly. No focal mass lesion. Adrenals/Urinary Tract: 2.1 cm left adrenal nodule is stable since prior CT and when comparing back to a study from 01/15/2015 suggesting benign etiology such is adenoma. Multiple lesions of varying signal intensity are seen in both kidneys, likely combination of simple and complex cysts. Stomach/Bowel: Stomach decompressed. Duodenum is normally positioned  as is the ligament of Treitz. No small bowel or colonic dilatation within the visualized abdomen. Vascular/Lymphatic: No abdominal aortic aneurysm. No abdominal lymphadenopathy. Other: No substantial intraperitoneal free fluid. Mild body wall edema evident. Musculoskeletal: No suspicious marrow signal abnormality evident within the visualized bony anatomy. IMPRESSION: 1. Limited exam as only 3 pulse sequences could be obtained due to patient discomfort. 2. Intra and extrahepatic biliary duct dilatation without definite choledocholithiasis. There is probable flow artifact in the distal common bile duct on 1 axial sequence, similar to artifact seen in the distal pancreatic duct. No filling defect evident in the common bile duct on the other 2 pulse sequences. Ultrasound or ERCP may prove helpful to further evaluate. 3. Bilateral renal  lesions suggesting cysts. Electronically Signed   By: Kennith CenterEric  Mansell M.D.   On: 02/26/2018 14:52   [place] Abdomen Limited Ruq  Result Date: 02/27/2018 CLINICAL DATA:  Right upper quadrant pain and elevated LFTs EXAM: ULTRASOUND ABDOMEN LIMITED RIGHT UPPER QUADRANT COMPARISON:  02/26/2018. FINDINGS: Gallbladder: Listen without evidence of cholelithiasis. Common bile duct: Diameter: Dilated to 13 mm without definitive obstructing stone. Liver: Scattered cysts are noted throughout the liver similar to that seen on recent MRI. No other focal mass is noted. Portal vein is patent on color Doppler imaging with normal direction of blood flow towards the liver. Mild intrahepatic ductal dilatation is noted IMPRESSION: Hepatic cysts stable from previous MRI. Prominent biliary system similar to that seen on the prior MRI without definitive obstructive abnormality. Electronically Signed   By: Alcide CleverMark  Lukens M.D.   On: 02/27/2018 11:25        Filed Weights   02/26/18 1000  Weight: 85.4 kg     Microbiology: No results found for this or any previous visit (from the past 240 hour(s)).     Blood Culture    Component Value Date/Time   SDES URINE, CLEAN CATCH 12/30/2015 2000   SPECREQUEST NONE 12/30/2015 2000   CULT MULTIPLE SPECIES PRESENT, SUGGEST RECOLLECTION (A) 12/30/2015 2000   REPTSTATUS 01/02/2016 FINAL 12/30/2015 2000      Labs: Results for orders placed or performed during the hospital encounter of 02/26/18 (from the past 48 hour(s))  Type and screen [facility]     Status: None   Collection Time: 02/26/18  5:46 AM  Result Value Ref Range   ABO/RH(D) O POS    Antibody Screen NEG    Sample Expiration      03/01/2018 Performed at [facility], [address] [place], [place] [address]   ABO/Rh     Status: None   Collection Time: 02/26/18  5:46 AM  Result Value Ref Range   ABO/RH(D)      O POS Performed at [facility], [address] [place], [place]  [address]   Hepatic function panel     Status: Abnormal   Collection Time: 02/26/18  6:05 AM  Result Value Ref Range   Total Protein 5.5 (L) 6.5 - 8.1 g/dL   Albumin 2.7 (L) 3.5 - 5.0 g/dL   AST 51 (H) 15 - 41 U/L   ALT 106 (H) 0 - 44 U/L   Alkaline Phosphatase 65 38 - 126 U/L   Total Bilirubin 0.9 0.3 - 1.2 mg/dL   Bilirubin, Direct 0.2 0.0 - 0.2 mg/dL   Indirect Bilirubin 0.7 0.3 - 0.9 mg/dL    Comment: Performed at [facility], [address] [place], [place] [address]  CBC with Differential/Platelet  Status: Abnormal   Collection Time: 02/26/18  6:05 AM  Result Value Ref Range   WBC 6.2 4.0 - 10.5 K/uL   RBC 2.71 (L) 3.87 - 5.11 MIL/uL   Hemoglobin 8.6 (L) 12.0 - 15.0 g/dL   HCT 16.1 (L) 09.6 - 04.5 %   MCV 95.6 80.0 - 100.0 fL   MCH 31.7 26.0 - 34.0 pg   MCHC 33.2 30.0 - 36.0 g/dL   RDW 40.9 81.1 - 91.4 %   Platelets 156 150 - 400 K/uL   nRBC 0.0 0.0 - 0.2 %   Neutrophils Relative % 79 %   Neutro Abs 4.9 1.7 - 7.7 K/uL   Lymphocytes Relative 5 %   Lymphs Abs 0.3 (L) 0.7 - 4.0 K/uL   Monocytes Relative 14 %   Monocytes Absolute 0.9 0.1 - 1.0 K/uL   Eosinophils Relative 2 %   Eosinophils Absolute 0.1 0.0 - 0.5 K/uL   Basophils Relative 0 %   Basophils Absolute 0.0 0.0 - 0.1 K/uL   Immature Granulocytes 0 %   Abs Immature Granulocytes 0.02 0.00 - 0.07 K/uL    Comment: Performed at [facility], [address], [place], [place] [address]  Basic metabolic panel     Status: Abnormal   Collection Time: 02/26/18  6:05 AM  Result Value Ref Range   Sodium 132 (L) 135 - 145 mmol/L   Potassium 3.3 (L) 3.5 - 5.1 mmol/L   Chloride 96 (L) 98 - 111 mmol/L   CO2 27 22 - 32 mmol/L   Glucose, Bld 119 (H) 70 - 99 mg/dL   BUN 19 8 - 23 mg/dL   Creatinine, Ser 6.21 (H) 0.44 - 1.00 mg/dL   Calcium 8.6 (L) 8.9 - 10.3 mg/dL   GFR calc non Af Amer 32 (L) >60 mL/min   GFR calc Af Amer 37 (L) >60 mL/min   Anion gap 9 5 - 15    Comment: Performed at [facility],  [address], [place], [place] [address]  Protime-INR     Status: None   Collection Time: 02/26/18  6:05 AM  Result Value Ref Range   Prothrombin Time 13.9 11.4 - 15.2 seconds   INR 1.08     Comment: Performed at [facility], [address] [place], [place] [address]  Comprehensive metabolic panel     Status: Abnormal   Collection Time: 02/27/18  2:47 AM  Result Value Ref Range   Sodium 133 (L) 135 - 145 mmol/L   Potassium 3.7 3.5 - 5.1 mmol/L   Chloride 98 98 - 111 mmol/L   CO2 27 22 - 32 mmol/L   Glucose, Bld 92 70 - 99 mg/dL   BUN 17 8 - 23 mg/dL   Creatinine, Ser 6.29 (H) 0.44 - 1.00 mg/dL   Calcium 8.5 (L) 8.9 - 10.3 mg/dL   Total Protein 5.2 (L) 6.5 - 8.1 g/dL   Albumin 2.5 (L) 3.5 - 5.0 g/dL   AST 43 (H) 15 - 41 U/L   ALT 86 (H) 0 - 44 U/L   Alkaline Phosphatase 71 38 - 126 U/L   Total Bilirubin 1.0 0.3 - 1.2 mg/dL   GFR calc non Af Amer 30 (L) >60 mL/min   GFR calc Af Amer 35 (L) >60 mL/min   Anion gap 8 5 - 15    Comment: Performed at [facility], [address], [place], [place] [address]  CBC  Status: Abnormal   Collection Time: 02/27/18  2:47 AM  Result Value Ref Range   WBC 4.7 4.0 - 10.5 K/uL   RBC 2.60 (L) 3.87 - 5.11 MIL/uL   Hemoglobin 8.2 (L) 12.0 - 15.0 g/dL   HCT 16.1 (L) 09.6 - 04.5 %   MCV 97.7 80.0 - 100.0 fL   MCH 31.5 26.0 - 34.0 pg   MCHC 32.3 30.0 - 36.0 g/dL   RDW 40.9 81.1 - 91.4 %   Platelets 152 150 - 400 K/uL   nRBC 0.0 0.0 - 0.2 %    Comment: Performed at [facility], [address], [place], [place] [address]     Lipid Panel  No results found for: CHOL, TRIG, HDL, CHOLHDL, VLDL, LDLCALC, LDLDIRECT   No results found for: HGBA1C   Lab Results  Component Value Date   CREATININE 1.52 (H) 02/27/2018     HPI :   Leah Crane is a 85 y.o. female with medical history significant of CKD stage III, baseline creatinine about 1.1, anemia of chronic disease, chronic venous insufficiency, peripheral arterial  disease, hypertension, hyperlipidemia who was admitted to the outside hospital on 02/23/2018 for intractable right lower extremity pain, erythema and swelling with concern for cellulitis.  She was treated with Keflex as outpatient with no improvement.  She was initially admitted as failed outpatient therapy for IV antibiotics.  On initial admission, she had no abdominal pain.  Her liver function test were normal.  Bilateral lower extremity duplex was negative for DVT.  Patient received pain medications in the emergency room for legs.  She was started on Zosyn and admitted to hospital for treatment of cellulitis.  On second day of admission, her routine morning labs showed her liver function enzymes increased with AST 188, ALT 219, bilirubin 1.6.  Patient was without any symptoms.  She had acute hepatitis panel done that was negative.  Right upper quadrant ultrasound was ordered and showed that the gallbladder was distended and had gallbladder wall thickening of 6.6 mm.  After all this diagnosis, patient started complaining of abdominal pain.  According to daughters at the bedside, patient is poor historian.  She might have been uncomfortable but not complaining of abdominal pain.  Patient did develop severe abdominal pain on the night of 02/24/2018 and received multiple doses of pain medications.  She had only  one episode of pain attack that has resolved since then.  CT scan of the abdomen pelvis was done with contrast that showed dilated common bile duct measuring 12 mm with questionable 6 mm stone within the distal common bile duct.  Distended gallbladder.   apparently needing ERCP and cholecystectomy, patient was transferred to [facility].   Ultrasound abdomen: 02/24/2018: Gallbladder is distended with gallbladder wall thickening of 6.6 mm. CT scan abdomen pelvis with contrast 02/24/2018: Dilated common bile duct 12 mm, dilated common hepatic duct 14 mm, questionable 6 mm stone within the distal common bile duct,  distended gallbladder with no gallbladder wall thickening or calcified gallstones.  HOSPITAL COURSE:   Choledocholithiasis with acute cholecystitis with obstruction: Improving liver function test in last 24 hours. Patient admitted as    n.p.o., received IV fluids, adequate pain medications as needed. Started on IV Zosyn for acute intra-abdominal infection. Patient evaluated by general surgery. She underwent MRI that showed intra-and extrahepatic biliary ductal dilatation without definite choledocholithiasis. Probable flow artifact in the distal common bile duct. No filling defect evident in the common bile duct. Hydromorphone ultrasound  showed prominent biliary system similar to seen on MRI without definite obstructive abnormality. LFTs trending down. As per general surgery Dr. Fredricka Bonine , there is no ampullary tumor, no stones, after discussion with the family family has decided that did not want surgery. They are hoping to take the patient home with pain medications and conservative treatment.   Acute kidney injury: With history of chronic kidney disease stage III, baseline creatinine about 1.1-1.2.  creatinine 1.52 prior to discharge . Patient and family recommended to hold Lasix, HCTZ, Zaroxolyn, ACE inhibitor until evaluated by PCP and advised to resume.     Acute on chronic anemia of chronic disease: Her hemoglobin in our records 10 months ago was 11.5.  Probably chronic anemia.  No evidence of active bleeding.  We will continue to monitor.  Currently no indication for transfusion.  Rheumatoid arthritis: On Plaquenil and chronic steroids.  Currently blood pressures are stable.  She can continue her oral prednisone.  If she goes to prolonged surgery, may need a stress dose of hydrocortisone.  Hypokalemia:  repleted  Chronic venous stasis: No evidence of active infection.  Continue leg elevation, compression with TED hose.  Discharge Exam:   Blood pressure 120/61, pulse 60, temperature 98 F  (36.7 C), temperature source Oral, resp. rate 16, height 5\' 7"  (1.702 m), weight 85.4 kg, SpO2 96 %.   Cardiovascular: Regular rate and rhythm, no murmurs / rubs / gallops. No extremity edema. 2+ pedal pulses. No carotid bruits.  Abdomen: she does have moderate tenderness along the right UQ. Positive murphy's sign. No hepatosplenomegaly. Bowel sounds positive.  Musculoskeletal: no clubbing / cyanosis. No joint deformity upper and lower extremities. Good ROM, no contractures. Normal muscle tone.  Skin: no rashes, lesions, ulcers. No induration, pigmentation and venous stasis changes both LE.  Neurologic: CN 2-12 grossly intact. Sensation intact, DTR normal. Strength 5/5 in all 4.    Follow-up Information    Olen Cordial, MD. Call.   Specialty:  Family Medicine Why:  hospital follow in 3-5 days Contact information: [address] [place] [address] [phone]           Signed: Richarda Overlie 02/27/2018, 12:13 PM      Time needed to  prepare  discharge, discussed with the patient and family 35 minutes

## 2018-02-27 NOTE — Progress Notes (Signed)
CC: Abdominal pain possible choledocholithiasis  Subjective: She says she is sore from all the test she is gone through.  Some upper airway wheezing, but her lungs are clear.  Currently she is not having abdominal pain, nausea, or vomiting.  Nominal ultrasound has been ordered and is pending.  Objective: Vital signs in last 24 hours: Temp:  [98 F (36.7 C)-98.2 F (36.8 C)] 98 F (36.7 C) (01/10 0415) Pulse Rate:  [60-72] 60 (01/10 0415) Resp:  [14-18] 16 (01/10 0415) BP: (111-120)/(41-61) 120/61 (01/10 0415) SpO2:  [96 %-100 %] 96 % (01/10 0415) Weight:  [85.4 kg] 85.4 kg (01/09 1000)    N.p.o. 800 urine recorded Afebrile vital signs are stable Creatinine 1.46 >>1.52 WBC 4.7 Hemoglobin 8.6>>8.2 Intake/Output from previous day: 01/09 0701 - 01/10 0700 In: 0  Out: 800 [Urine:800] Intake/Output this shift: No intake/output data recorded.  General appearance: alert, cooperative, no distress and She has some upper airway wheezing.,  She is mouth breathing today. Resp: clear to auscultation bilaterally GI: Soft, not tender this a.m.  No nausea or vomiting this a.m.  Positive bowel sounds.  Lab Results:  Recent Labs    02/26/18 0605 02/27/18 0247  WBC 6.2 4.7  HGB 8.6* 8.2*  HCT 25.9* 25.4*  PLT 156 152    BMET Recent Labs    02/26/18 0605 02/27/18 0247  NA 132* 133*  K 3.3* 3.7  CL 96* 98  CO2 27 27  GLUCOSE 119* 92  BUN 19 17  CREATININE 1.46* 1.52*  CALCIUM 8.6* 8.5*   PT/INR Recent Labs    02/26/18 0605  LABPROT 13.9  INR 1.08    Recent Labs  Lab 02/26/18 0605 02/27/18 0247  AST 51* 43*  ALT 106* 86*  ALKPHOS 65 71  BILITOT 0.9 1.0  PROT 5.5* 5.2*  ALBUMIN 2.7* 2.5*     Lipase  No results found for: LIPASE   Medications: . felodipine  10 mg Oral q morning - 10a  . Ferrous Fumarate  1 tablet Oral QODAY  . HYDROcodone-acetaminophen  1 tablet Oral TID  . hydroxychloroquine  200 mg Oral q morning - 10a  . nicotine  14 mg  Transdermal Daily  . pantoprazole  40 mg Oral Daily  . pramipexole  0.25 mg Oral QHS  . predniSONE  5 mg Oral Q breakfast  . tiZANidine  4 mg Oral TID  . vitamin B-12  1,000 mcg Oral q morning - 10a    Assessment/Plan  CKD-3 with AKI - Cr 1.46 Chronic anemia - Hbg  8.6>>8.2 HTN HLD GERD PAD Venous insufficiency Arthritis on daily prednisone 5mg  and plaquenil Chronic pain - take Norco 10/325mg  2-3x daily Chronic constipation - takes metamucil and senna daily at home  Acute cholecystitis Elevated LFTs, possible choledocholithiasis - Patient with signs of acute cholecystitis on imaging and exam. LFTs were elevated but are trending down, bilirubin now within normal limits. U/s and CT do not show any definite gallstones, will order MRCP for further evaluation. Continue to trend LFTs. With patient's age and MMP will ask cardiology to see for cardiac clearance prior to proceeding with surgery. Keep NPO for now and continue antibiotics.  - MRCP: Intra-and extrahepatic biliary duct dilatation without definite choledocholithiasis questionable flow artifact in the distal common bile duct similar artifact in the pancreatic duct filling defect in the common bile duct they recommended abdominal ultrasound. - Abdominal ultrasound pending   ID - zosyn 1/6>> VTE - SCDs FEN - IVF, NPO  Foley - wick  Plan: Findings noted above.  Abdominal ultrasound is pending.  She is n.p.o. for possible surgery later.   LOS: 1 day    Haiden Clucas 02/27/2018 [phone]

## 2018-02-27 NOTE — Progress Notes (Signed)
Pharmacy Antibiotic Note  Leah Crane is a 85 y.o. female admitted on 02/26/2018 with choledocholithiasis.  Pharmacy has been consulted for Zosyn dosing.  ID: [id number] for choledocholithiasis, immunocompromised with pred PTA Afebrile, WBC WNL, no micro. Scr 1.52 up slightly  Zosyn 1/9 >> Keflex PTA  Plan: Zosyn EID 3.375g IV Q8H. Dose ok down to a CrCl of 10. Pharmacy will sign off. Please reconsult for further dosing assitance.   Height: 5\' 7"  (170.2 cm) Weight: 188 lb 4.4 oz (85.4 kg) IBW/kg (Calculated) : 61.6  Temp (24hrs), Avg:98.1 F (36.7 C), Min:98 F (36.7 C), Max:98.2 F (36.8 C)  Recent Labs  Lab 02/26/18 0605 02/27/18 0247  WBC 6.2 4.7  CREATININE 1.46* 1.52*    Estimated Creatinine Clearance: 28.2 mL/min (A) (by C-G formula based on SCr of 1.52 mg/dL (H)).    No Known Allergies   Lorenna Lurry S. Merilynn Finland, PharmD, BCPS Clinical Staff Pharmacist Misty Stanley Stillinger 02/27/2018 9:39 AM

## 2018-04-07 ENCOUNTER — Other Ambulatory Visit: Payer: Self-pay

## 2018-04-07 ENCOUNTER — Emergency Department (HOSPITAL_COMMUNITY)
Admission: EM | Admit: 2018-04-07 | Discharge: 2018-04-07 | Disposition: A | Discharge status: Home / Self Care | Payer: Medicare Other | Attending: Emergency Medicine | Admitting: Emergency Medicine

## 2018-04-07 ENCOUNTER — Encounter (HOSPITAL_COMMUNITY): Payer: Self-pay | Admitting: *Deleted

## 2018-04-07 DIAGNOSIS — I10 Essential (primary) hypertension: Secondary | ICD-10-CM | POA: Diagnosis not present

## 2018-04-07 DIAGNOSIS — M79604 Pain in right leg: Secondary | ICD-10-CM | POA: Diagnosis not present

## 2018-04-07 DIAGNOSIS — R2243 Localized swelling, mass and lump, lower limb, bilateral: Secondary | ICD-10-CM | POA: Diagnosis not present

## 2018-04-07 DIAGNOSIS — R609 Edema, unspecified: Secondary | ICD-10-CM

## 2018-04-07 DIAGNOSIS — Z79899 Other long term (current) drug therapy: Secondary | ICD-10-CM | POA: Diagnosis not present

## 2018-04-07 DIAGNOSIS — Z7982 Long term (current) use of aspirin: Secondary | ICD-10-CM | POA: Diagnosis not present

## 2018-04-07 DIAGNOSIS — M79605 Pain in left leg: Secondary | ICD-10-CM | POA: Diagnosis present

## 2018-04-07 LAB — COMPREHENSIVE METABOLIC PANEL
ALT: 15 U/L (ref 0–44)
AST: 27 U/L (ref 15–41)
Albumin: 4.2 g/dL (ref 3.5–5.0)
Alkaline Phosphatase: 62 U/L (ref 38–126)
Anion gap: 14 (ref 5–15)
BUN: 20 mg/dL (ref 8–23)
CO2: 26 mmol/L (ref 22–32)
CREATININE: 1.28 mg/dL — AB (ref 0.44–1.00)
Calcium: 9.9 mg/dL (ref 8.9–10.3)
Chloride: 96 mmol/L — ABNORMAL LOW (ref 98–111)
GFR calc Af Amer: 43 mL/min — ABNORMAL LOW (ref >60)
GFR calc non Af Amer: 37 mL/min — ABNORMAL LOW (ref >60)
Glucose, Bld: 100 mg/dL — ABNORMAL HIGH (ref 70–99)
Potassium: 4.1 mmol/L (ref 3.5–5.1)
Sodium: 136 mmol/L (ref 135–145)
Total Bilirubin: 0.6 mg/dL (ref 0.3–1.2)
Total Protein: 7.5 g/dL (ref 6.5–8.1)

## 2018-04-07 LAB — CBC WITH DIFFERENTIAL/PLATELET
Abs Immature Granulocytes: 0.02 10*3/uL (ref 0.00–0.07)
Basophils Absolute: 0 10*3/uL (ref 0.0–0.1)
Basophils Relative: 0 %
Eosinophils Absolute: 0.1 10*3/uL (ref 0.0–0.5)
Eosinophils Relative: 1 %
HEMATOCRIT: 37.5 % (ref 36.0–46.0)
Hemoglobin: 12 g/dL (ref 12.0–15.0)
Immature Granulocytes: 0 %
Lymphocytes Relative: 14 %
Lymphs Abs: 1 10*3/uL (ref 0.7–4.0)
MCH: 31.7 pg (ref 26.0–34.0)
MCHC: 32 g/dL (ref 30.0–36.0)
MCV: 99.2 fL (ref 80.0–100.0)
MONOS PCT: 14 %
Monocytes Absolute: 1 10*3/uL (ref 0.1–1.0)
Neutro Abs: 4.7 10*3/uL (ref 1.7–7.7)
Neutrophils Relative %: 71 %
Platelets: 317 10*3/uL (ref 150–400)
RBC: 3.78 MIL/uL — ABNORMAL LOW (ref 3.87–5.11)
RDW: 12.7 % (ref 11.5–15.5)
WBC: 6.8 10*3/uL (ref 4.0–10.5)
nRBC: 0 % (ref 0.0–0.2)

## 2018-04-07 LAB — D-DIMER, QUANTITATIVE: D-Dimer, Quant: 0.85 ug/mL-FEU — ABNORMAL HIGH (ref 0.00–0.50)

## 2018-04-07 MED ORDER — MORPHINE SULFATE (PF) 4 MG/ML IV SOLN
4.0000 mg | Freq: Once | INTRAVENOUS | Status: AC
  Administered 2018-04-07: 4 mg via INTRAVENOUS
  Filled 2018-04-07: qty 1

## 2018-04-07 MED ORDER — FUROSEMIDE 10 MG/ML IJ SOLN
20.0000 mg | Freq: Once | INTRAMUSCULAR | Status: AC
  Administered 2018-04-07: 20 mg via INTRAVENOUS
  Filled 2018-04-07: qty 2

## 2018-04-07 MED ORDER — ONDANSETRON HCL 4 MG/2ML IJ SOLN
4.0000 mg | Freq: Once | INTRAMUSCULAR | Status: AC
  Administered 2018-04-07: 4 mg via INTRAVENOUS
  Filled 2018-04-07: qty 2

## 2018-04-07 MED ORDER — OXYCODONE-ACETAMINOPHEN 5-325 MG PO TABS: 1.0000 | ORAL_TABLET | ORAL | 0 refills | Status: AC | PRN

## 2018-04-07 NOTE — ED Notes (Signed)
Pt trying to climb out the bottom of the bed, states her legs are better dangling. Pt given recliner to sit in.

## 2018-04-07 NOTE — ED Provider Notes (Signed)
[facility] EMERGENCY DEPARTMENT Provider Note   CSN: [id number] Arrival date & time: 04/07/18  1831    History   Chief Complaint Chief Complaint  Patient presents with  . Leg Pain    HPI Leah Crane is a 85 y.o. female.     Pt presents to the ED today with bilateral leg pain.  Pt has had intermittent leg pain for months.  Family said it is due to leg swelling and PVD.  The pt takes hydrocodone at home which did not help today.  Pt has not had any redness to her legs.  Pt only takes lasix twice a week b/c of her kidneys.     Past Medical History:  Diagnosis Date  . Arthritis   . Chronic constipation   . GERD (gastroesophageal reflux disease)   . History of hiatal hernia   . Hypertension   . Hypokalemia   . Multiple thyroid nodules   . Peripheral edema   . Restless leg     Patient Active Problem List   Diagnosis Date Noted  . Choledocholithiasis with acute cholecystitis with obstruction 02/26/2018  . Hypokalemia 02/26/2018  . Venous stasis dermatitis of both lower extremities 02/26/2018  . RA (rheumatoid arthritis) (HCC) 02/26/2018  . Nonrheumatic mitral valve regurgitation   . Chest pain 12/30/2015  . GERD (gastroesophageal reflux disease)   . History of hiatal hernia   . AKI (acute kidney injury) (HCC) 01/16/2015  . Colitis 01/16/2015  . Urinary tract infectious disease 01/16/2015  . Hypertension 01/16/2015  . Syncope 01/15/2015    Past Surgical History:  Procedure Laterality Date  . ABDOMINAL HYSTERECTOMY    . HERNIA REPAIR    . HIATAL HERNIA REPAIR    . REPLACEMENT TOTAL KNEE       OB History   No obstetric history on file.      Home Medications    Prior to Admission medications   Medication Sig Start Date End Date Taking? Authorizing Provider  aspirin EC 81 MG tablet Take 81 mg by mouth See admin instructions. MON, WED, FRI, SUN    [provider]  Calcium Carb-Cholecalciferol (CALCIUM 600+D) 600-800 MG-UNIT TABS Take 1 tablet  by mouth 2 (two) times daily.    [provider]  felodipine (PLENDIL) 10 MG 24 hr tablet Take 10 mg by mouth every morning.  01/05/15   [provider]  Ferrous Fumarate (HEMOCYTE - 106 MG FE) 324 (106 Fe) MG TABS tablet Take 1 tablet by mouth every other day.    [provider]  furosemide (LASIX) 40 MG tablet Take 40 mg by mouth See admin instructions. Mondays and Fridays    [provider]  hydrochlorothiazide (HYDRODIURIL) 25 MG tablet Take 1 tablet (25 mg total) by mouth every morning. 03/20/18   Richarda Overlie, MD  HYDROcodone-acetaminophen (NORCO) 10-325 MG tablet Take 1 tablet by mouth every 6 (six) hours as needed for moderate pain or severe pain. 02/27/18   Richarda Overlie, MD  hydroxychloroquine (PLAQUENIL) 200 MG tablet Take 200 mg by mouth every morning.     [provider]  metolazone (ZAROXOLYN) 2.5 MG tablet Take 1 tablet (2.5 mg total) by mouth every Monday, Wednesday, and Friday. Start 03/20/18 02/27/18   Richarda Overlie, MD  omeprazole (PRILOSEC) 20 MG capsule Take 20 mg by mouth every morning.    [provider]  ondansetron (ZOFRAN) 4 MG tablet Take 1 tablet (4 mg total) by mouth every 6 (six) hours as needed for nausea.  02/27/18   Richarda Overlie, MD  oxyCODONE-acetaminophen (PERCOCET/ROXICET) 5-325 MG tablet Take 1 tablet by mouth every 4 (four) hours as needed for severe pain. 04/07/18   Jacalyn Lefevre, MD  potassium chloride SA (K-DUR,KLOR-CON) 20 MEQ tablet Take 20 mEq by mouth 2 (two) times daily.     [provider]  pramipexole (MIRAPEX) 0.25 MG tablet Take 0.25 mg by mouth at bedtime.  03/21/17   [provider]  predniSONE (DELTASONE) 5 MG tablet Take 5 mg by mouth every morning.    [provider]  psyllium (HYDROCIL/METAMUCIL) 95 % PACK Take 1 packet by mouth daily.    [provider]  quinapril (ACCUPRIL) 40 MG tablet Take 1 tablet (40 mg total) by mouth 2 (two) times daily. 03/20/18   Richarda Overlie, MD  ranitidine (ZANTAC) 150 MG tablet Take 150 mg by mouth daily at 6 PM.    [provider]  senna (SENOKOT) 8.6 MG TABS tablet Take 2 tablets by mouth 2 (two) times daily.     [provider]  tiZANidine (ZANAFLEX) 4 MG tablet Take 4 mg by mouth 3 (three) times daily.     [provider]  vitamin B-12 (CYANOCOBALAMIN) 1000 MCG tablet Take 1,000 mcg by mouth every morning.    [provider]    Family History Family History  Problem Relation Age of Onset  . Cancer - Lung Father     Social History Social History   Tobacco Use  . Smoking status: Never Smoker  . Smokeless tobacco: Never Used  Substance Use Topics  . Alcohol use: No  . Drug use: No     Allergies   Patient has no known allergies.   Review of Systems Review of Systems  Musculoskeletal:       Bilateral leg pain and swelling  All other systems reviewed and are negative.    Physical Exam Updated Vital Signs BP 140/72   Pulse 70   Temp 98.1 F (36.7 C) (Oral)   Resp 16   Ht 5\' 5"  (1.651 m)   Wt 88 kg   SpO2 98%   BMI 32.28 kg/m   Physical Exam Vitals signs and nursing note reviewed.  Constitutional:      Appearance: Normal appearance.  HENT:     Head: Normocephalic and atraumatic.     Right Ear: External ear normal.     Left Ear: External ear normal.     Nose: Nose normal.     Mouth/Throat:     Mouth: Mucous membranes are moist.  Eyes:     Extraocular Movements: Extraocular movements intact.     Pupils: Pupils are equal, round, and reactive to light.  Neck:     Musculoskeletal: Normal range of motion and neck supple.  Cardiovascular:     Rate and Rhythm: Normal rate and regular rhythm.     Pulses: Normal pulses.     Heart sounds: Normal heart sounds.  Pulmonary:     Effort: Pulmonary effort is normal.     Breath sounds: Normal breath sounds.  Abdominal:     General: Abdomen is flat.     Palpations: Abdomen is soft.  Musculoskeletal: Normal  range of motion.     Right lower leg: Edema present.     Left lower leg: Edema present.  Skin:    General: Skin is warm and dry.     Capillary Refill: Capillary refill takes less than 2 seconds.  Neurological:     General:  No focal deficit present.     Mental Status: She is alert and oriented to person, place, and time.  Psychiatric:        Mood and Affect: Mood normal.        Behavior: Behavior normal.      ED Treatments / Results  Labs (all labs ordered are listed, but only abnormal results are displayed) Labs Reviewed  COMPREHENSIVE METABOLIC PANEL - Abnormal; Notable for the following components:      Result Value   Chloride 96 (*)    Glucose, Bld 100 (*)    Creatinine, Ser 1.28 (*)    GFR calc non Af Amer 37 (*)    GFR calc Af Amer 43 (*)    All other components within normal limits  CBC WITH DIFFERENTIAL/PLATELET - Abnormal; Notable for the following components:   RBC 3.78 (*)    All other components within normal limits  D-DIM[facility], QUANTITATIVE (NOT [facility]) - Abnormal; Notable for the following components:   D-Dimer, Quant 0.85 (*)    All other components within normal limits  URINALYSIS, ROUTINE W REFLEX MICROSCOPIC    EKG None  Radiology No results found.  Procedures Procedures (including critical care time)  Medications Ordered in ED Medications  furosemide (LASIX) injection 20 mg (20 mg Intravenous Given 04/07/18 2055)  morphine 4 MG/ML injection 4 mg (4 mg Intravenous Given 04/07/18 2051)  ondansetron (ZOFRAN) injection 4 mg (4 mg Intravenous Given 04/07/18 2054)     Initial Impression / Assessment and Plan / ED Course  I have reviewed the triage vital signs and the nursing notes.  Pertinent labs & imaging results that were available during my care of the patient were reviewed by me and considered in my medical decision making (see chart for details).       Pt is much more comfortable.  She likely has pain from PVD and from edema.  Her GFR is 37  which is a little better than it was in the hospital.  LFTs are stable.  DDimer is negative when age adjusted.  Her family lives with her and arranges her meds.  I will have the family give her lasix daily for the next week and see if that helps with the swelling and the pain.  The pt is stable for d/c.  Return if worse.  Final Clinical Impressions(s) / ED Diagnoses   Final diagnoses:  Bilateral leg pain  Peripheral edema    ED Discharge Orders         Ordered    oxyCODONE-acetaminophen (PERCOCET/ROXICET) 5-325 MG tablet  Every 4 hours PRN     04/07/18 2235           Jacalyn LefevreHaviland, [place]um[place]h Yeagley, MD 04/07/18 2247

## 2018-04-07 NOTE — ED Notes (Signed)
Pt sats running 86-88% on RA. Pt placed on 2L per [place]. Sats increased to 96-98%

## 2018-04-07 NOTE — Discharge Instructions (Addendum)
Take the lasix daily for 1 week.

## 2018-04-07 NOTE — ED Triage Notes (Signed)
Pt c/o right leg pain that started to get severe last night; pt has swelling to bilateral lower legs

## 2019-03-16 IMAGING — US US ABDOMEN LIMITED
1 series · 14 of 25 positions shown · non-contrast
Comparison: 02/26/2018.

CLINICAL DATA: Right upper quadrant pain and elevated LFTs

EXAM:
ULTRASOUND ABDOMEN LIMITED RIGHT UPPER QUADRANT

[Series 1: us abdomen limited · 0.25mm/px · 14 of 53 slices shown]
[im 1/53]
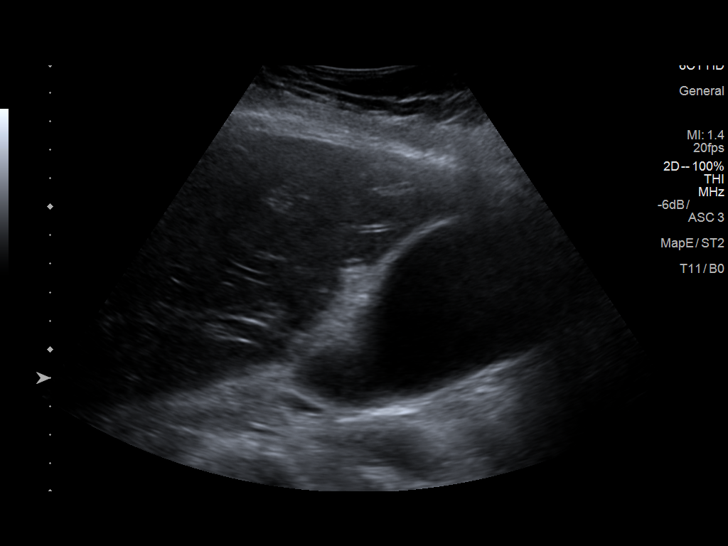
[im 5/53]
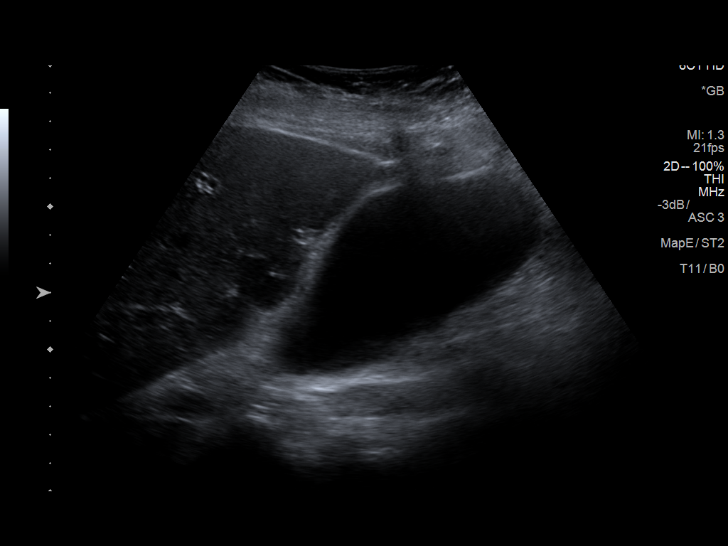
[im 9/53]
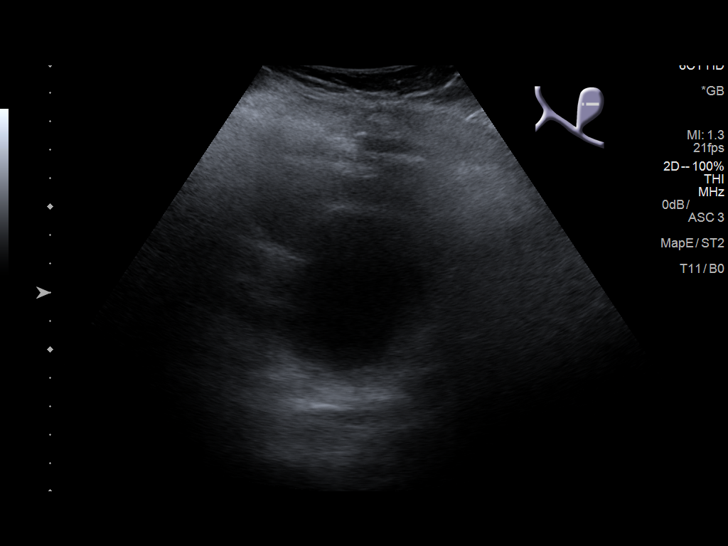
[im 14/53]
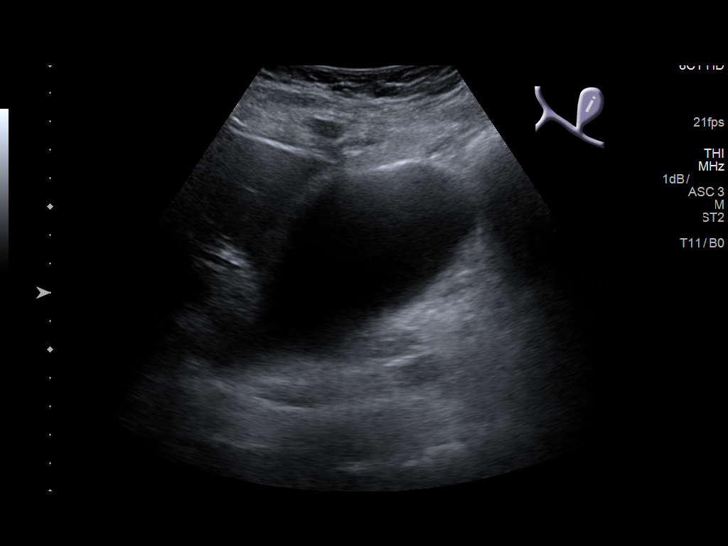
[im 18/53]
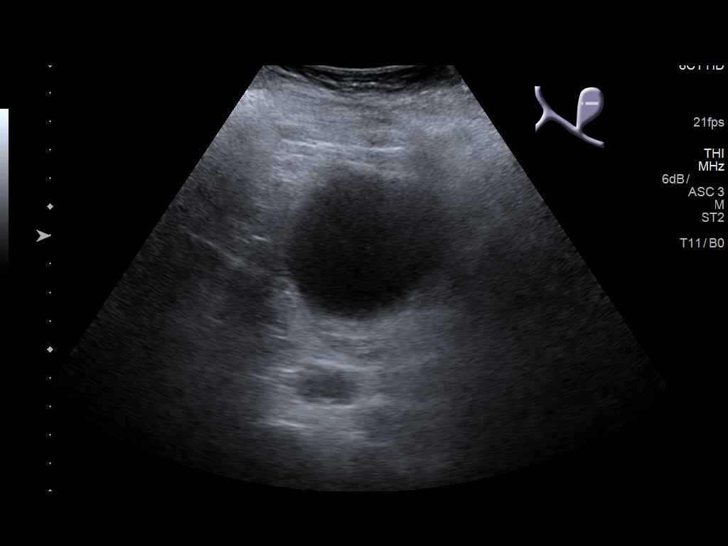
[im 20/53]
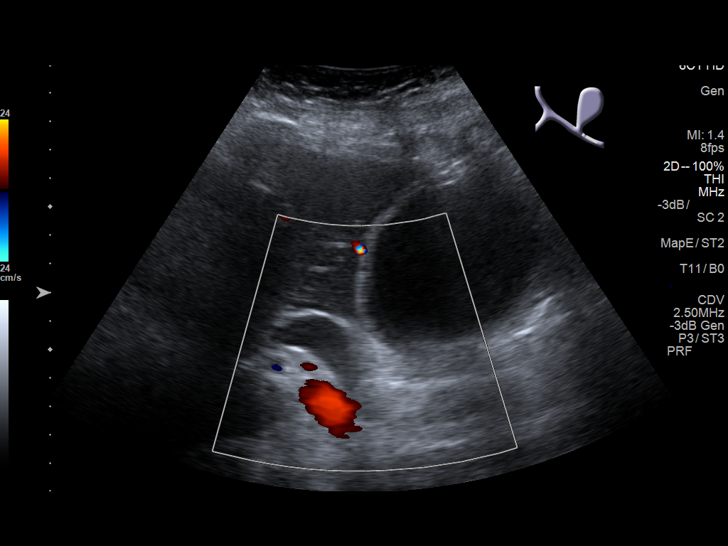
[im 24/53]
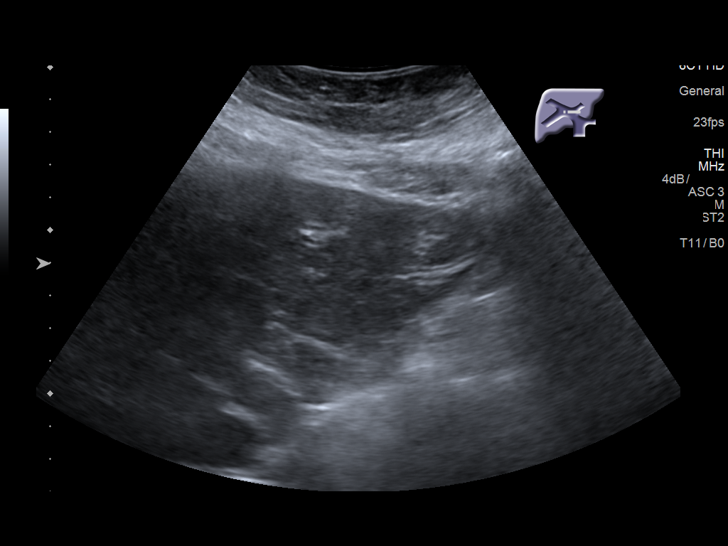
[im 29/53]
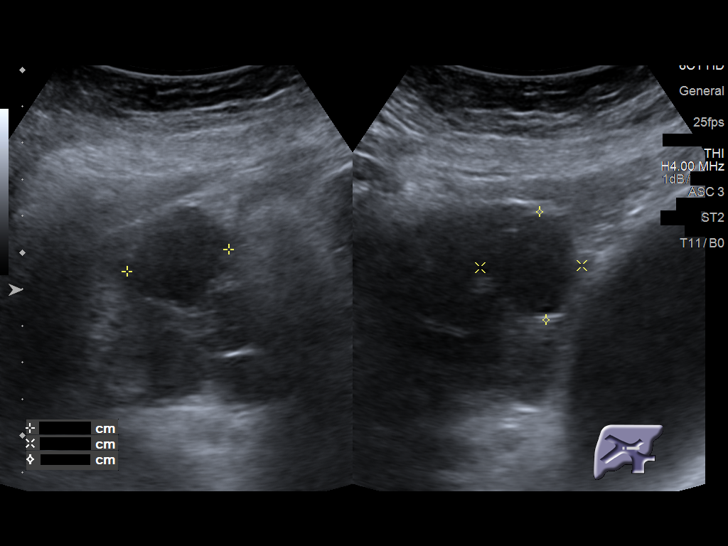
[im 33/53]
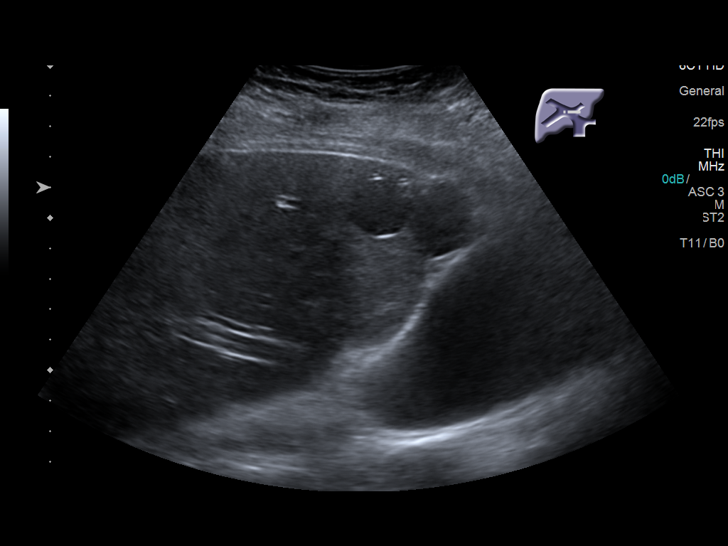
[im 35/53]
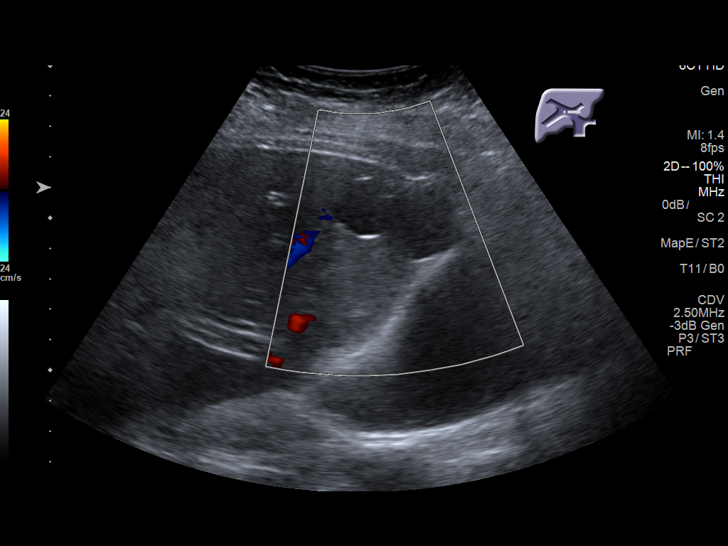
[im 40/53]
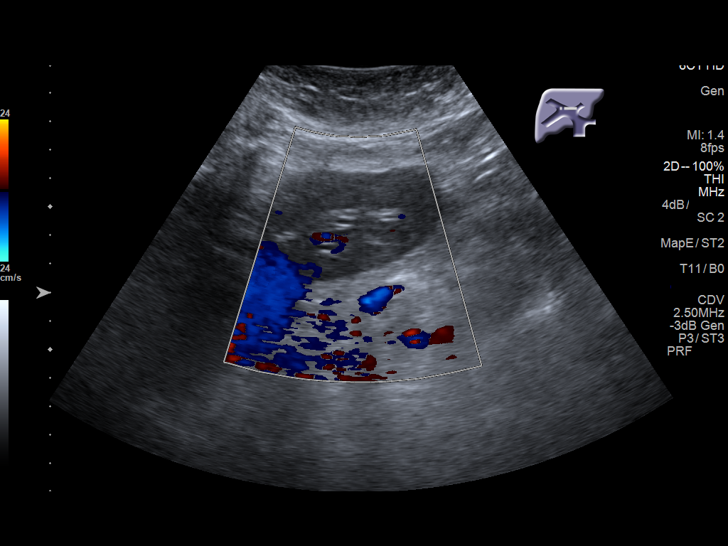
[im 44/53]
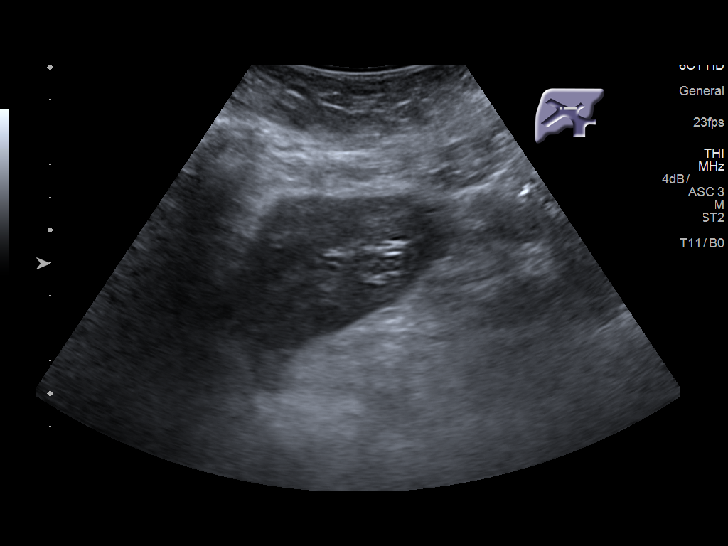
[im 48/53]
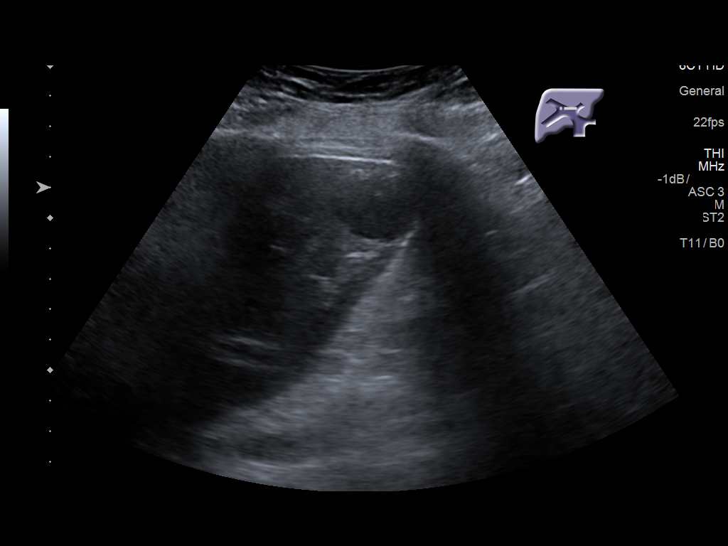
[im 53/53]
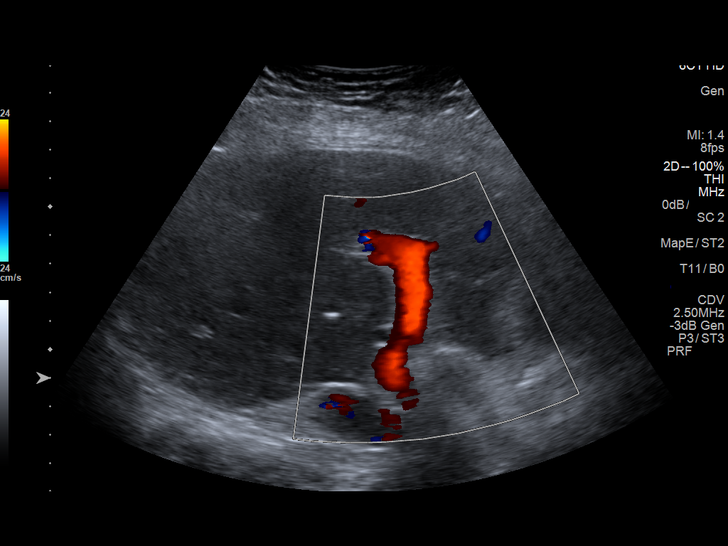

[14 of 25 positions shown; findings below may reference images not displayed]

FINDINGS: Gallbladder:

Listen without evidence of cholelithiasis.

Common bile duct:

Diameter: Dilated to 13 mm without definitive obstructing stone.

Liver:

Scattered cysts are noted throughout the liver similar to that seen
on recent MRI. No other focal mass is noted. Portal vein is patent
on color Doppler imaging with normal direction of blood flow towards
the liver. Mild intrahepatic ductal dilatation is noted
IMPRESSION: Hepatic cysts stable from previous MRI.

Prominent biliary system similar to that seen on the prior MRI
without definitive obstructive abnormality.

## 2019-04-19 DEATH — deceased

## 2020-10-27 IMAGING — MR MR ABDOMEN W/O CM
3 series · 18 of 48 positions shown · non-contrast
Comparison: CT scan 05/08/2017

CLINICAL DATA: Elevated LFTs.

EXAM:
MRI ABDOMEN WITHOUT CONTRAST
TECHNIQUE: Multiplanar multisequence MR imaging was performed without the
administration of intravenous contrast.

[Series 4: cor ssfse nav · coronal · 6.0mm · 0.78mm/px · 11 of 36 slices shown]
[im 3/36]
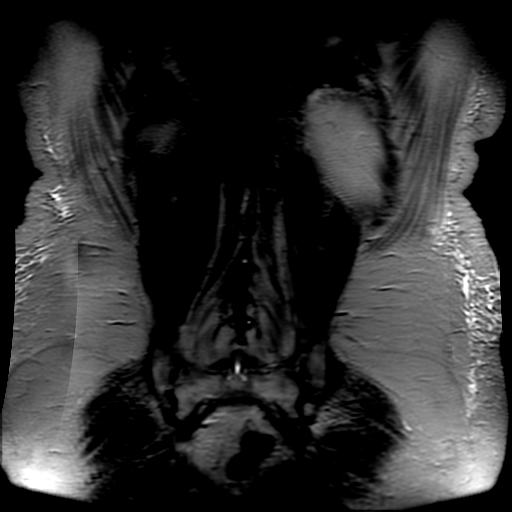
[im 5/36]
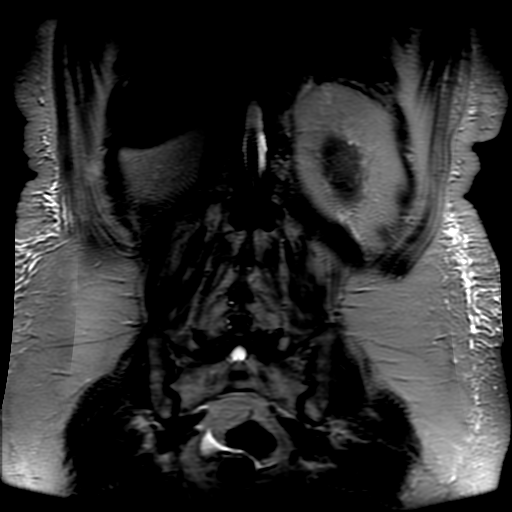
[im 7/36]
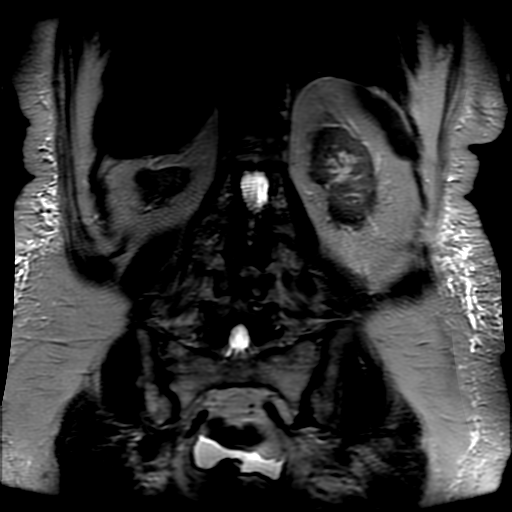
[im 11/36]
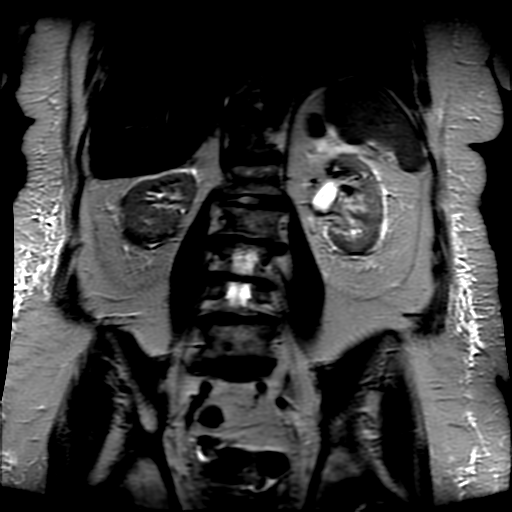
[im 16/36]
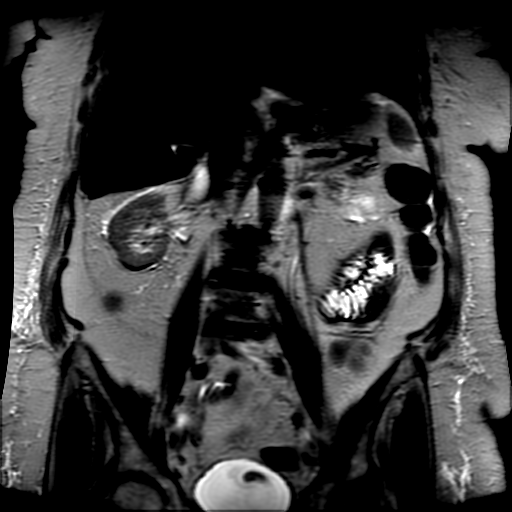
[im 18/36]
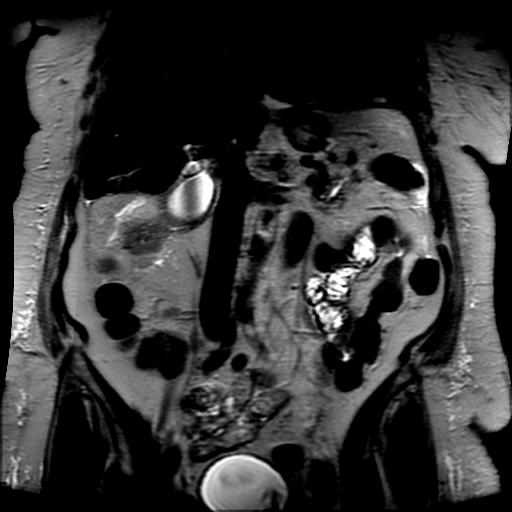
[im 20/36]
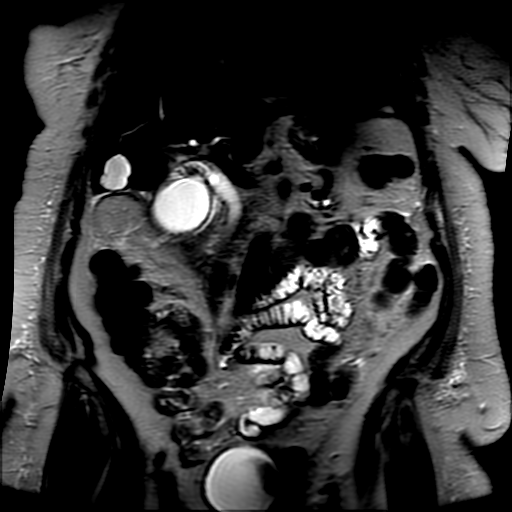
[im 25/36]
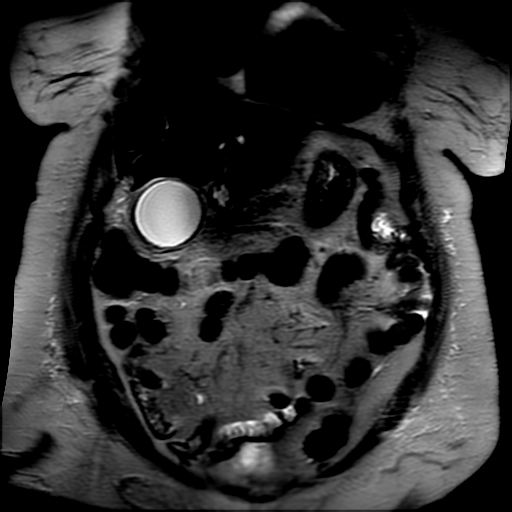
[im 29/36]
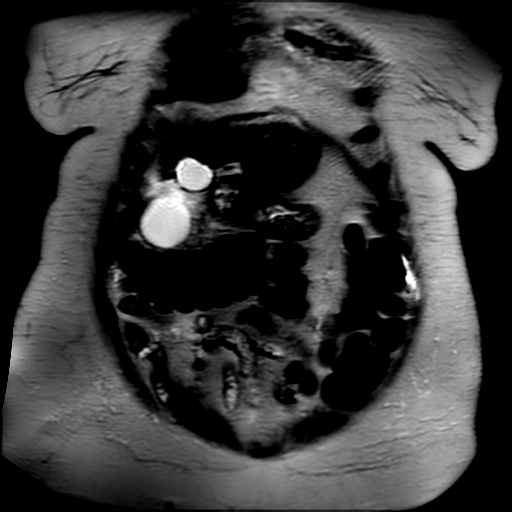
[im 31/36]
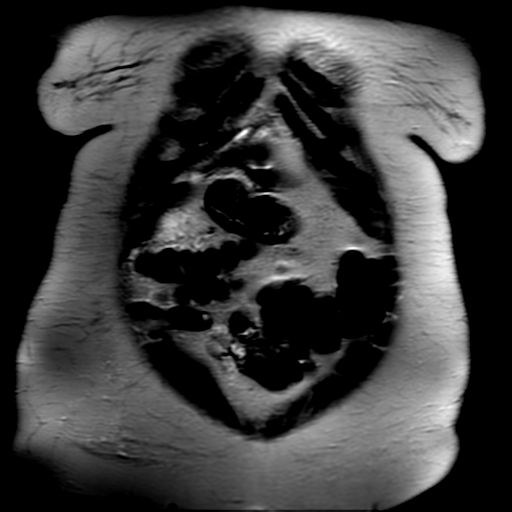
[im 33/36]
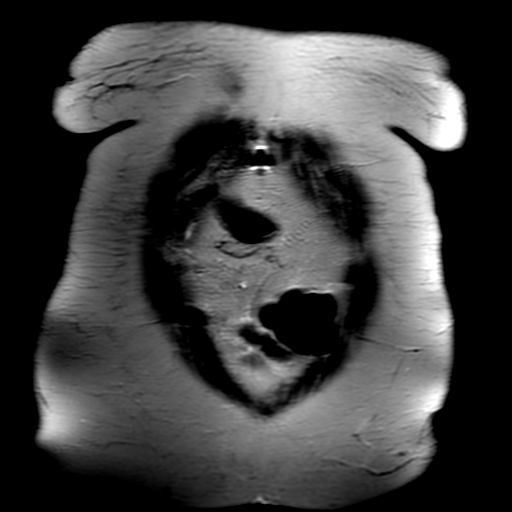

[Series 6: ax ssfse nav · axial · 6.0mm · 0.82mm/px · z∈[+34,+196]mm · 4 of 35 slices shown]
[im 3/35]
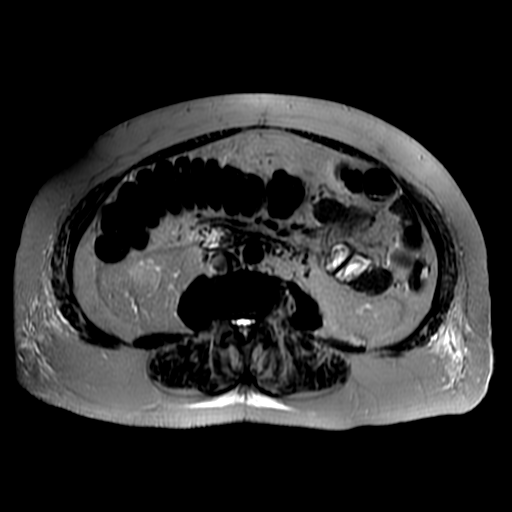
[im 5/35]
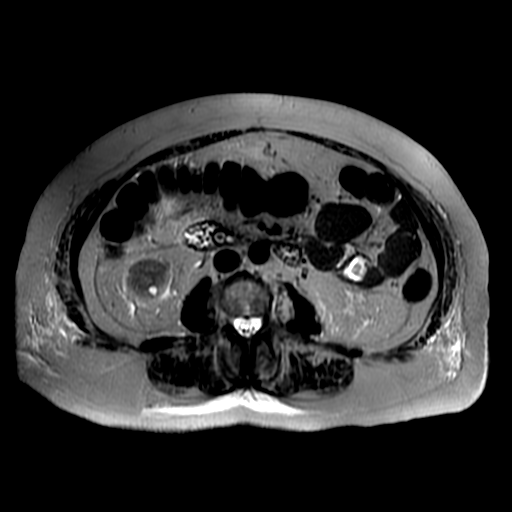
[im 19/35]
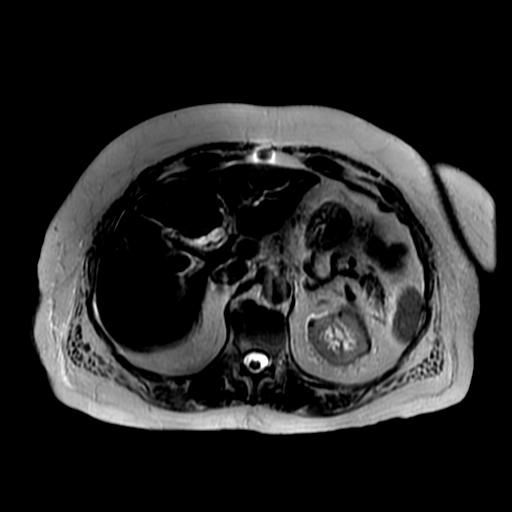
[im 30/35]
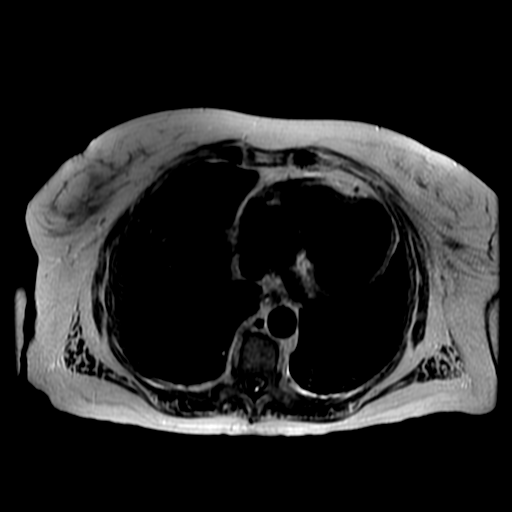

[Series 7: T2 fat-sat · axial · 6.0mm · 0.82mm/px · z∈[+44,+190]mm · 3 of 32 slices shown]
[im 5/32]
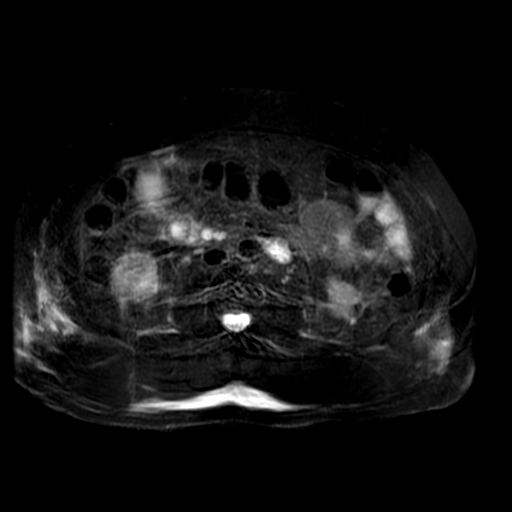
[im 16/32]
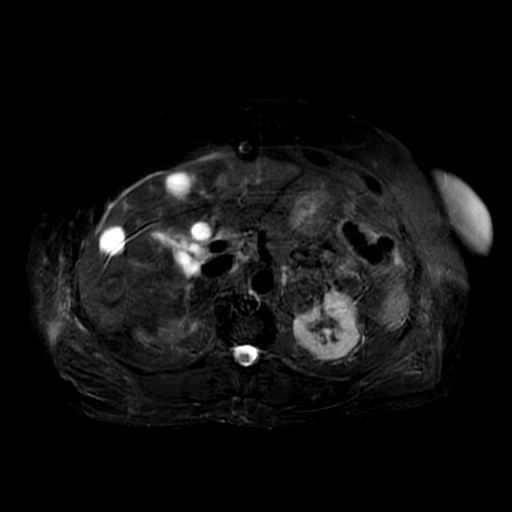
[im 27/32]
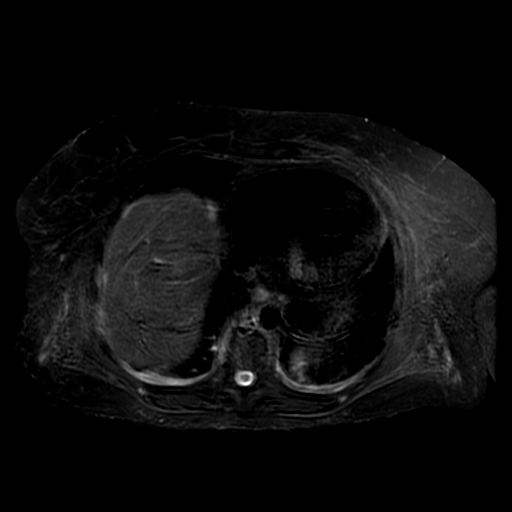

[18 of 48 positions shown; findings below may reference images not displayed]

FINDINGS: Markedly limited study in that the patient was only able to tolerate
3 pulse sequences due to leg pain.

Lower chest: Heart size upper normal to mildly increased.

Hepatobiliary: Limited evaluation shows cysts in the inferior liver,
similar to prior CT. Intra and extrahepatic biliary duct dilatation
is evident. There is probable flow artifact in the distal common
bile duct ([DATE]) in similar flow artifact is seen in the central
pancreatic duct on [DATE]. No filling defect is evident in the common
bile duct on the other 2 T2 weighted sequences. Gallbladder is
distended

Pancreas: Pancreas diffusely atrophic with diffuse dilatation of the
main pancreatic duct measuring up to 6 mm diameter.

Spleen:  No splenomegaly. No focal mass lesion.

Adrenals/Urinary Tract: 2.1 cm left adrenal nodule is stable since
prior CT and when comparing back to a study from 01/15/2015
suggesting benign etiology such is adenoma. Multiple lesions of
varying signal intensity are seen in both kidneys, likely
combination of simple and complex cysts.

Stomach/Bowel: Stomach decompressed. Duodenum is normally positioned
as is the ligament of Treitz. No small bowel or colonic dilatation
within the visualized abdomen.

Vascular/Lymphatic: No abdominal aortic aneurysm. No abdominal
lymphadenopathy.

Other: No substantial intraperitoneal free fluid. Mild body wall
edema evident.

Musculoskeletal: No suspicious marrow signal abnormality evident
within the visualized bony anatomy.
IMPRESSION: 1. Limited exam as only 3 pulse sequences could be obtained due to
patient discomfort.
2. Intra and extrahepatic biliary duct dilatation without definite
choledocholithiasis. There is probable flow artifact in the distal
common bile duct on 1 axial sequence, similar to artifact seen in
the distal pancreatic duct. No filling defect evident in the common
bile duct on the other 2 pulse sequences. Ultrasound or ERCP may
prove helpful to further evaluate.
3. Bilateral renal lesions suggesting cysts.
